# Patient Record
Sex: Female | Born: 1950 | Race: Black or African American | Hispanic: No | Smoking: Never smoker
Health system: Southern US, Community
[De-identification: ages and names within clinical notes are randomized; demographics above are authoritative.]

## PROBLEM LIST (undated history)

## (undated) DIAGNOSIS — E785 Hyperlipidemia, unspecified: Secondary | ICD-10-CM

## (undated) DIAGNOSIS — M8580 Other specified disorders of bone density and structure, unspecified site: Secondary | ICD-10-CM

## (undated) HISTORY — DX: Other specified disorders of bone density and structure, unspecified site: M85.80

## (undated) HISTORY — PX: DILATION AND CURETTAGE OF UTERUS: SHX78

## (undated) HISTORY — PX: CATARACT EXTRACTION: SUR2

## (undated) HISTORY — DX: Hyperlipidemia, unspecified: E78.5

## (undated) HISTORY — PX: COLONOSCOPY: SHX174

## (undated) HISTORY — PX: WISDOM TOOTH EXTRACTION: SHX21

## (undated) HISTORY — PX: OTHER SURGICAL HISTORY: SHX169

---

## 1999-01-01 ENCOUNTER — Emergency Department (HOSPITAL_COMMUNITY): Admission: EM | Admit: 1999-01-01 | Discharge: 1999-01-01 | Payer: Self-pay | Admitting: Emergency Medicine

## 2002-02-22 ENCOUNTER — Other Ambulatory Visit: Admission: RE | Admit: 2002-02-22 | Discharge: 2002-02-22 | Payer: Self-pay | Admitting: Obstetrics and Gynecology

## 2003-05-29 ENCOUNTER — Other Ambulatory Visit: Admission: RE | Admit: 2003-05-29 | Discharge: 2003-05-29 | Payer: Self-pay | Admitting: Obstetrics and Gynecology

## 2004-08-17 ENCOUNTER — Other Ambulatory Visit: Admission: RE | Admit: 2004-08-17 | Discharge: 2004-08-17 | Payer: Self-pay | Admitting: Obstetrics and Gynecology

## 2004-11-16 ENCOUNTER — Ambulatory Visit: Payer: Self-pay | Admitting: Internal Medicine

## 2004-12-06 ENCOUNTER — Ambulatory Visit (HOSPITAL_COMMUNITY): Admission: RE | Admit: 2004-12-06 | Discharge: 2004-12-06 | Payer: Self-pay | Admitting: Gastroenterology

## 2005-04-29 ENCOUNTER — Ambulatory Visit: Payer: Self-pay | Admitting: Internal Medicine

## 2005-10-17 ENCOUNTER — Other Ambulatory Visit: Admission: RE | Admit: 2005-10-17 | Discharge: 2005-10-17 | Payer: Self-pay | Admitting: Obstetrics and Gynecology

## 2008-03-06 ENCOUNTER — Encounter: Payer: Self-pay | Admitting: Internal Medicine

## 2008-03-17 ENCOUNTER — Ambulatory Visit: Payer: Self-pay | Admitting: Internal Medicine

## 2008-03-17 DIAGNOSIS — E785 Hyperlipidemia, unspecified: Secondary | ICD-10-CM | POA: Insufficient documentation

## 2008-03-17 LAB — CONVERTED CEMR LAB
Cholesterol, target level: 200 mg/dL
HDL goal, serum: 40 mg/dL
LDL Goal: 160 mg/dL

## 2008-06-16 ENCOUNTER — Ambulatory Visit: Payer: Self-pay | Admitting: Internal Medicine

## 2008-06-23 ENCOUNTER — Ambulatory Visit: Payer: Self-pay | Admitting: Internal Medicine

## 2008-06-23 ENCOUNTER — Encounter (INDEPENDENT_AMBULATORY_CARE_PROVIDER_SITE_OTHER): Payer: Self-pay | Admitting: *Deleted

## 2008-06-23 LAB — CONVERTED CEMR LAB
Cholesterol, target level: 200 mg/dL
Cholesterol: 217 mg/dL (ref 0–200)
HDL goal, serum: 50 mg/dL
HDL: 79.9 mg/dL (ref >39.0)
LDL Goal: 100 mg/dL
Total Bilirubin: 0.7 mg/dL (ref 0.3–1.2)
Triglycerides: 69 mg/dL (ref 0–149)

## 2008-09-22 ENCOUNTER — Ambulatory Visit: Payer: Self-pay | Admitting: Internal Medicine

## 2008-09-22 LAB — CONVERTED CEMR LAB
ALT: 15 units/L (ref 0–35)
AST: 22 units/L (ref 0–37)
Bilirubin, Direct: 0.1 mg/dL (ref 0.0–0.3)
Cholesterol: 220 mg/dL (ref 0–200)
Direct LDL: 123.2 mg/dL
Total Protein: 7.6 g/dL (ref 6.0–8.3)

## 2008-09-29 ENCOUNTER — Ambulatory Visit: Payer: Self-pay | Admitting: Internal Medicine

## 2008-12-22 ENCOUNTER — Ambulatory Visit: Payer: Self-pay | Admitting: Internal Medicine

## 2008-12-22 DIAGNOSIS — T887XXA Unspecified adverse effect of drug or medicament, initial encounter: Secondary | ICD-10-CM | POA: Insufficient documentation

## 2008-12-26 LAB — CONVERTED CEMR LAB
ALT: 18 units/L (ref 0–35)
AST: 27 units/L (ref 0–37)
Bilirubin, Direct: 0.1 mg/dL (ref 0.0–0.3)
Direct LDL: 115.4 mg/dL
HDL: 83.6 mg/dL (ref >39.0)
Total CHOL/HDL Ratio: 2.5
Triglycerides: 70 mg/dL (ref 0–149)

## 2008-12-29 ENCOUNTER — Encounter (INDEPENDENT_AMBULATORY_CARE_PROVIDER_SITE_OTHER): Payer: Self-pay | Admitting: *Deleted

## 2008-12-31 ENCOUNTER — Ambulatory Visit: Payer: Self-pay | Admitting: Internal Medicine

## 2008-12-31 LAB — CONVERTED CEMR LAB
HDL goal, serum: 50 mg/dL
LDL Goal: 100 mg/dL

## 2009-09-23 ENCOUNTER — Ambulatory Visit: Payer: Self-pay | Admitting: Internal Medicine

## 2009-09-28 ENCOUNTER — Encounter (INDEPENDENT_AMBULATORY_CARE_PROVIDER_SITE_OTHER): Payer: Self-pay | Admitting: *Deleted

## 2009-09-28 LAB — CONVERTED CEMR LAB
ALT: 26 units/L (ref 0–35)
AST: 36 units/L (ref 0–37)
Bilirubin, Direct: 0 mg/dL (ref 0.0–0.3)
Cholesterol: 227 mg/dL — ABNORMAL HIGH (ref 0–200)
HDL: 92 mg/dL (ref >39.00)
Total Protein: 7.6 g/dL (ref 6.0–8.3)
Triglycerides: 94 mg/dL (ref 0.0–149.0)
VLDL: 18.8 mg/dL (ref 0.0–40.0)

## 2010-03-16 ENCOUNTER — Encounter: Payer: Self-pay | Admitting: Internal Medicine

## 2010-03-25 ENCOUNTER — Encounter: Admission: RE | Admit: 2010-03-25 | Discharge: 2010-03-25 | Payer: Self-pay | Admitting: Obstetrics and Gynecology

## 2010-03-30 ENCOUNTER — Ambulatory Visit: Payer: Self-pay | Admitting: Internal Medicine

## 2010-03-30 DIAGNOSIS — M25469 Effusion, unspecified knee: Secondary | ICD-10-CM | POA: Insufficient documentation

## 2010-03-30 DIAGNOSIS — M8580 Other specified disorders of bone density and structure, unspecified site: Secondary | ICD-10-CM | POA: Insufficient documentation

## 2010-04-13 ENCOUNTER — Encounter: Admission: RE | Admit: 2010-04-13 | Discharge: 2010-04-13 | Payer: Self-pay | Admitting: Orthopedic Surgery

## 2010-10-07 ENCOUNTER — Encounter: Payer: Self-pay | Admitting: Internal Medicine

## 2010-12-12 ENCOUNTER — Encounter: Payer: Self-pay | Admitting: Obstetrics and Gynecology

## 2010-12-23 NOTE — Assessment & Plan Note (Signed)
Summary: CPX//FD   Vital Signs:  Patient profile:   60 year old female Height:      60.75 inches Weight:      161.2 pounds BMI:     30.82 Temp:     98.5 degrees F oral Pulse rate:   64 / minute Resp:     14 per minute BP sitting:   126 / 80  (left arm) Cuff size:   large  Vitals Entered By: Shonna Chock (Mar 30, 2010 1:15 PM) CC: Follow-up visit: Discuss Chlosterol, patient states "Not a CPX", Lipid Management Comments REVIEWED MED LIST, PATIENT AGREED DOSE AND INSTRUCTION CORRECT    CC:  Follow-up visit: Discuss Chlosterol, patient states "Not a CPX", and Lipid Management.  History of Present Illness: Sandra [name] is here for med refill of  Crestor & Zetia & for review of labs from Dr [employer] office. She is resuming low cholesterol diet;decreased  CVE due to knee pain but still walking 1 mpd almost daily.NMR reviewed  & risk discussed. No premature MI in FH. LDL 109 , TG 149 & HDL 72 on Crestor & Zetia.  Lipid Management History:      Positive NCEP/ATP III risk factors include female age 1 years old or older.  Negative NCEP/ATP III risk factors include no history of early menopause without estrogen hormone replacement, non-diabetic, HDL cholesterol greater than 60, no family history for ischemic heart disease, non-tobacco-user status, non-hypertensive, no ASHD (atherosclerotic heart disease), no prior stroke/TIA, no peripheral vascular disease, and no history of aortic aneurysm.     Allergies (verified): No Known Drug Allergies  Past History:  Past Medical History: Hyperlipidemia: NMR Lipoprofile 2006: LDL 185(4004/3130), HDL 65, TG 162. LDL goal = < 100(Note: [place] goal = < 160) Osteopenia ? , Dr Arelia Sneddon  Past Surgical History: G 0 P 0; Colonoscopy negative 2006  Review of Systems CV:  Denies chest pain or discomfort, leg cramps with exertion, palpitations, and shortness of breath with exertion. MS:  Complains of joint pain and joint swelling; denies muscle aches and  muscle weakness; S/P cortisone shot L knee by Dr Lajoyce Corners for instability post injury. Also PMH of steroid injections in ankles bilaterally. BMD done by Dr Arelia Sneddon; ? osteopenia. On Ca++ 600 mg once daily  with  vitamin D(?200 International Units).  Physical Exam  General:  well-nourished, alert,appropriate and cooperative throughout examination Head:  Normocephalic and atraumatic without obvious abnormalities.  Neck:  No deformities, masses, or tenderness noted. Lungs:  Normal respiratory effort, chest expands symmetrically. Lungs are clear to auscultation, no crackles or wheezes. Heart:  normal rate, regular rhythm, no gallop, no rub, no JVD, no HJR, and grade 1/2 /6 systolic murmur.   Abdomen:  Bowel sounds positive,abdomen soft and non-tender without masses, organomegaly or hernias noted. Pulses:  R and L carotid,radial,dorsalis pedis and posterior tibial pulses are full and equal bilaterally Extremities:  No clubbing, cyanosis. Large effusion L knee. Crepitus R > L knee Neurologic:  alert & oriented X3 and DTRs symmetrical and normal.   Skin:  Intact without suspicious lesions or rashes Cervical Nodes:  No lymphadenopathy noted Axillary Nodes:  No palpable lymphadenopathy Psych:  memory intact for recent and remote, normally interactive, and good eye contact.     Impression & Recommendations:  Problem # 1:  HYPERLIPIDEMIA (ICD-272.4) Lipids essentially @ goal Her updated medication list for this problem includes:    Crestor 20 Mg Tabs (Rosuvastatin calcium) .Marland Kitchen... 1qd    Zetia 10 Mg Tabs (Ezetimibe) .Marland KitchenMarland KitchenMarland KitchenMarland Kitchen  1 once daily *appointment due**  Problem # 2:  OSTEOPENIA (ICD-733.90) to be verified  Problem # 3:  JOINT EFFUSION, LEFT KNEE (ICD-719.06)  S/P steroid injection X 1  Orders: Orthopedic Referral (Ortho)  Complete Medication List: 1)  Asa 81mg   2)  Crestor 20 Mg Tabs (Rosuvastatin calcium) .Marland Kitchen.. 1qd 3)  Zetia 10 Mg Tabs (Ezetimibe) .Marland Kitchen.. 1 once daily *appointment due** 4)  Lab  Orders- 272.4,995.20  .... Hepatic,lipid,bmp,cbcd,tsh 5)  Tramadol Hcl 50 Mg Tabs (Tramadol hcl) .Marland Kitchen.. 1 every 6 hrs as needed for knee pain  Lipid Assessment/Plan:      Based on NCEP/ATP III, the patient's risk factor category is "0-1 risk factors".  The patient's lipid goals are as follows: Total cholesterol goal is 200; LDL cholesterol goal is 100; HDL cholesterol goal is 50; Triglyceride goal is 150.  Her LDL cholesterol goal has been met.  Secondary causes for hyperlipidemia have been ruled out.  She has been counseled on adjunctive measures for lowering her cholesterol and has been provided with dietary instructions.    Patient Instructions: 1)  Please provide copy of  BMD report. Vitamin D level should be checked annually. Prescriptions: TRAMADOL HCL 50 MG TABS (TRAMADOL HCL) 1 every 6 hrs as needed for knee pain  #30 x 2   Entered and Authorized by:   Marga Melnick MD   Signed by:   Marga Melnick MD on 03/30/2010   Method used:   Print then Give to Patient   RxID:   [id number] ZETIA 10 MG TABS (EZETIMIBE) 1 once daily *APPOINTMENT DUE**  #30 x 11   Entered and Authorized by:   Marga Melnick MD   Signed by:   Marga Melnick MD on 03/30/2010   Method used:   Print then Give to Patient   RxID:   [id number] CRESTOR 20 MG  TABS (ROSUVASTATIN CALCIUM) 1qd  #30 Tablet x 11   Entered and Authorized by:   Marga Melnick MD   Signed by:   Marga Melnick MD on 03/30/2010   Method used:   Print then Give to Patient   RxID:   [id number]

## 2010-12-23 NOTE — Medication Information (Signed)
Summary: Nonadherence with Zetia/[facility]  Nonadherence with Zetia/[facility]   Imported By: Lanelle Bal 11/01/2010 14:22:24  _____________________________________________________________________  External Attachment:    Type:   Image     Comment:   External Document

## 2011-04-08 NOTE — Op Note (Signed)
[name], [name]                ACCOUNT NO.:  [id number]   MEDICAL RECORD NO.:  [id number]          PATIENT TYPE:  AMB   LOCATION:  ENDO                         FACILITY:  [facility]   PHYSICIAN:  Graylin Shiver, M.D.   DATE OF BIRTH:  Aug 27, 1951   DATE OF PROCEDURE:  12/06/2004  DATE OF DISCHARGE:                                 OPERATIVE REPORT   INDICATION:  Screening.   Informed consent was obtained after explanation of the risks of bleeding,  infection, and perforation.   PREMEDICATIONS:  Fentanyl 62.5 mcg IV, versed 6 mg IV.   PROCEDURE:  With the patient in the left lateral decubitus position, a  rectal exam was performed.  No masses were felt.  The Olympus colonoscope  was inserted into the rectum and advanced around the colon to the cecum.  Cecal landmarks were identified.  The cecum and ascending colon were normal.  The transverse colon normal.  The descending colon, sigmoid, and rectum were  normal.  She tolerated the procedure well without complications.   IMPRESSION:  Normal colonoscopy to the cecum.   I would recommend a follow-up colonoscopy again in 10 years.      SFG/MEDQ  D:  12/06/2004  T:  12/06/2004  Job:  [id number]   cc:   Juluis Mire, M.D.  [address] [place]  [place] [address]  Fax: [phone]

## 2011-05-06 ENCOUNTER — Other Ambulatory Visit: Payer: Self-pay | Admitting: Internal Medicine

## 2011-06-14 ENCOUNTER — Telehealth: Payer: Self-pay | Admitting: Internal Medicine

## 2011-06-14 MED ORDER — ROSUVASTATIN CALCIUM 20 MG PO TABS: 20.0000 mg | ORAL_TABLET | Freq: Every day | ORAL | Status: DC

## 2011-06-14 MED ORDER — EZETIMIBE 10 MG PO TABS: 10.0000 mg | ORAL_TABLET | Freq: Every day | ORAL | Status: DC

## 2011-06-14 NOTE — Telephone Encounter (Signed)
Sent in

## 2011-08-18 ENCOUNTER — Encounter: Payer: Self-pay | Admitting: Internal Medicine

## 2011-08-18 ENCOUNTER — Ambulatory Visit (INDEPENDENT_AMBULATORY_CARE_PROVIDER_SITE_OTHER): Payer: 59 | Admitting: Internal Medicine

## 2011-08-18 DIAGNOSIS — M25572 Pain in left ankle and joints of left foot: Secondary | ICD-10-CM

## 2011-08-18 DIAGNOSIS — M25579 Pain in unspecified ankle and joints of unspecified foot: Secondary | ICD-10-CM

## 2011-08-18 DIAGNOSIS — M2140 Flat foot [pes planus] (acquired), unspecified foot: Secondary | ICD-10-CM

## 2011-08-18 NOTE — Patient Instructions (Signed)
Please schedule an appointment with the [facility]; review and correct this note prior to that visit. If you need an official referral please call.

## 2011-08-18 NOTE — Progress Notes (Signed)
  Subjective:    Patient ID: Sandra [name], female    DOB: 06/28/1951, 60 y.o.   MRN: [id number]  HPI Extremity lesion Location:inferior to L medial malleolus Onset:2 weeks ago as "knot"  Trigger/injury:no but fracture L lateral malleolar area Pain quality:"catching"/ soreness Pain severity:up to 8-9 after standing for > 6 hrs working part time @ [employer] Duration:hours Radiation:no Exacerbating factors:standing as noted Treatment/response: Aleve/ no benefit Review of systems: Constitutional: no fever, chills, sweats, change in weight  Musculoskeletal:no  muscle cramps or pain; no  joint stiffness, redness, or swelling Skin:no rash, color change Neuro: no weakness;  numbness and tingling Heme:no lymphadenopathy; abnormal bruising or bleeding       Review of Systems     Objective:   Physical Exam   She is healthy and well-nourished in appearance; she is in no distress.  Skin over the feet appears normal with no ischemic changes  The pedal pulses are excellent. Strength and deep tendon reflexes are normal in the  lower extremities. Range of motion is equal at in both ankles. 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 This appears slightly larger than a  similar finding in the same area in the right medial ankle.  The most striking change is pes planus.        Assessment & Plan:  #1 ankle dysfunction after prolonged standing. There does appear to be some asymmetry of the osseous structures. She has significant pes planus.  Plan: I would recommend assessment by a Podiatrist. She would most likely benefit from appropriate orthopedic footwear. The present symptoms may be related to the prior ankle fracture & pre-existing  pes planus.

## 2011-09-12 ENCOUNTER — Ambulatory Visit (INDEPENDENT_AMBULATORY_CARE_PROVIDER_SITE_OTHER): Payer: 59 | Admitting: Internal Medicine

## 2011-09-12 ENCOUNTER — Encounter: Payer: Self-pay | Admitting: Internal Medicine

## 2011-09-12 VITALS — BP 128/90 | HR 88 | Temp 98.4°F | Resp 14 | Ht 61.75 in | Wt 177.8 lb

## 2011-09-12 DIAGNOSIS — M899 Disorder of bone, unspecified: Secondary | ICD-10-CM

## 2011-09-12 DIAGNOSIS — M949 Disorder of cartilage, unspecified: Secondary | ICD-10-CM

## 2011-09-12 DIAGNOSIS — E785 Hyperlipidemia, unspecified: Secondary | ICD-10-CM

## 2011-09-12 DIAGNOSIS — Z0000 Encounter for general adult medical examination without abnormal findings: Secondary | ICD-10-CM

## 2011-09-12 LAB — CBC WITH DIFFERENTIAL/PLATELET
Basophils Absolute: 0.1 10*3/uL (ref 0.0–0.1)
Basophils Relative: 0.8 % (ref 0.0–3.0)
Eosinophils Absolute: 0 10*3/uL (ref 0.0–0.7)
HCT: 39.5 % (ref 36.0–46.0)
Hemoglobin: 13.2 g/dL (ref 12.0–15.0)
Lymphs Abs: 1.5 10*3/uL (ref 0.7–4.0)
MCV: 95.6 fl (ref 78.0–100.0)
Monocytes Absolute: 0.6 10*3/uL (ref 0.1–1.0)
Monocytes Relative: 8 % (ref 3.0–12.0)
Neutrophils Relative %: 70.9 % (ref 43.0–77.0)
Platelets: 256 10*3/uL (ref 150.0–400.0)

## 2011-09-12 LAB — BASIC METABOLIC PANEL
Calcium: 9.1 mg/dL (ref 8.4–10.5)
Glucose, Bld: 93 mg/dL (ref 70–99)
Sodium: 143 mEq/L (ref 135–145)

## 2011-09-12 LAB — HEPATIC FUNCTION PANEL
AST: 23 U/L (ref 0–37)
Albumin: 3.9 g/dL (ref 3.5–5.2)

## 2011-09-12 LAB — TSH: TSH: 1.39 u[IU]/mL (ref 0.35–5.50)

## 2011-09-12 NOTE — Progress Notes (Signed)
Subjective:    Patient ID: Sandra [name], female    DOB: 04/13/1951, 60 y.o.   MRN: [id number]  HPI  Sandra [name] is here for a physical; she has no acute issues.      Review of Systems Patient reports no vision/ hearing  changes, adenopathy,fever, weight change,  persistant / recurrent hoarseness , swallowing issues, chest pain,palpitations,edema,persistant /recurrent cough, hemoptysis, dyspnea( rest/ exertional/paroxysmal nocturnal), gastrointestinal bleeding(melena, rectal bleeding), abdominal pain, significant heartburn,  bowel changes,GU symptoms(dysuria, hematuria,pyuria, incontinence ), Gyn symptoms(abnormal  bleeding , pain),  syncope, focal weakness, memory loss,numbness & tingling, skin/hair /nail changes,abnormal bruising or bleeding, anxiety,or depression.   She states that she is not exercising on a regular basis. She feels her diet is not optimal.    Objective:   Physical Exam Gen.: Healthy and well-nourished in appearance. Alert, appropriate and cooperative throughout exam. Head: Normocephalic without obvious abnormalities Eyes: No corneal or conjunctival inflammation noted. Pupils equal round reactive to light and accommodation. Fundal exam poorly visualized. Extraocular motion intact. Vision grossly normal with lenses. Ears: External  ear exam reveals no significant lesions or deformities. Canals clear .TMs normal. Hearing is grossly normal bilaterally. Nose: External nasal exam reveals no deformity or inflammation. Nasal mucosa are pink and moist. No lesions or exudates noted.  Mouth: Oral mucosa and oropharynx reveal no lesions or exudates. Teeth in good repair. Neck: No deformities, masses, or tenderness noted. Range of motion & . Thyroid normal. Lungs: Normal respiratory effort; chest expands symmetrically. Lungs are clear to auscultation without rales, wheezes, or increased work of breathing. Heart: Normal rate and rhythm. Normal S1 and S2. No gallop, click, or rub.  Intermittent Grade 1/2 systolic basilar  murmur. Abdomen: Bowel sounds normal; abdomen soft and nontender. No masses, organomegaly or hernias noted. Genitalia: Dr Arelia Sneddon   .                                                                                   Musculoskeletal/extremities: Slight lordosis  noted of  the thoracic  spine. No clubbing, cyanosis, edema, or deformity noted. Range of motion  normal .Tone & strength  normal.Joints normal. Nail health  Good.Pes planus. Crepitus L knee w/o effusion Vascular: Carotid, radial artery, dorsalis pedis and  posterior tibial pulses are full and equal. No bruits present. Neurologic: Alert and oriented x3. Deep tendon reflexes symmetrical and normal.         Skin: Intact without suspicious lesions or rashes. Lymph: No cervical, axillary lymphadenopathy present. Psych: Mood and affect are normal. Normally interactive                                                                                         Assessment & Plan:  #1 comprehensive physical exam; no acute findings #2 see Problem List with Assessments & Recommendations #3 she has a  history in her family of premature coronary disease; her MMR suggested a 40% long term risk previously Plan: see Orders  EKG: Normal except for one PAC. Low voltage in the precordial leads is related to body habitus/breast tissue. She does describe rare palpitations. Prior EKG 11/16/2004 revealed minor early repolarization changes.

## 2011-09-12 NOTE — Patient Instructions (Signed)
Preventive Health Care: Exercise  30-45  minutes a day, 3-4 days a week. Walking is especially valuable in preventing Osteoporosis. Eat a low-fat diet with lots of fruits and vegetables, up to 7-9 servings per day. Consume less than 30 grams of sugar per day from foods & drinks with High Fructose Corn Syrup as # 1,2,3 or #4 on label. Health Care Power of Attorney & Living Will place you in charge of your health care  decisions. Verify these are  in place. To prevent palpitations or premature beats, avoid stimulants such as decongestants, diet pills, nicotine, or caffeine (coffee, tea, cola, or chocolate) to excess.  Blood Pressure Goal  Ideally is an AVERAGE < 135/85. This AVERAGE should be calculated from @ least 5-7 BP readings taken @ different times of day on different days of week. You should not respond to isolated BP readings , but rather the AVERAGE for that week

## 2011-09-22 ENCOUNTER — Encounter: Payer: Self-pay | Admitting: Internal Medicine

## 2011-10-03 ENCOUNTER — Encounter: Payer: Self-pay | Admitting: Internal Medicine

## 2011-11-25 ENCOUNTER — Other Ambulatory Visit: Payer: Self-pay | Admitting: Internal Medicine

## 2011-11-25 NOTE — Telephone Encounter (Signed)
rx sent to pharmacy by e-script  

## 2012-03-10 ENCOUNTER — Other Ambulatory Visit: Payer: Self-pay | Admitting: Internal Medicine

## 2012-10-16 ENCOUNTER — Other Ambulatory Visit: Payer: Self-pay | Admitting: Internal Medicine

## 2012-10-16 NOTE — Telephone Encounter (Signed)
Rx sent for 30 day supply only pt need appt last seen 10/12.    MW

## 2012-12-08 ENCOUNTER — Other Ambulatory Visit: Payer: Self-pay | Admitting: Internal Medicine

## 2012-12-10 NOTE — Telephone Encounter (Signed)
Patient needs to schedule a CPX with fasting labs

## 2013-01-07 ENCOUNTER — Encounter: Payer: Self-pay | Admitting: Family Medicine

## 2013-01-07 ENCOUNTER — Encounter: Payer: Self-pay | Admitting: Lab

## 2013-01-07 ENCOUNTER — Ambulatory Visit (INDEPENDENT_AMBULATORY_CARE_PROVIDER_SITE_OTHER)
Admission: RE | Admit: 2013-01-07 | Discharge: 2013-01-07 | Disposition: A | Discharge status: Home / Self Care | Payer: 59 | Source: Ambulatory Visit | Attending: Family Medicine | Admitting: Family Medicine

## 2013-01-07 ENCOUNTER — Ambulatory Visit (INDEPENDENT_AMBULATORY_CARE_PROVIDER_SITE_OTHER): Payer: 59 | Admitting: Family Medicine

## 2013-01-07 VITALS — BP 120/70 | HR 97 | Temp 98.3°F | Wt 184.8 lb

## 2013-01-07 DIAGNOSIS — J209 Acute bronchitis, unspecified: Secondary | ICD-10-CM

## 2013-01-07 MED ORDER — GUAIFENESIN-CODEINE 100-10 MG/5ML PO SYRP: ORAL_SOLUTION | ORAL | Status: DC

## 2013-01-07 MED ORDER — AZITHROMYCIN 250 MG PO TABS: ORAL_TABLET | ORAL | Status: DC

## 2013-01-07 NOTE — Progress Notes (Signed)
  Subjective:     Sandra [name] is a 62 y.o. female here for evaluation of a cough. Onset of symptoms was 6 days ago. Symptoms have been gradually worsening since that time. The cough is productive and is aggravated by infection and reclining position. Associated symptoms include: shortness of breath, sputum production and wheezing. Patient does not have a history of asthma. Patient does not have a history of environmental allergens. Patient has not traveled recently. Patient does not have a history of smoking. Patient has not had a previous chest x-ray. Patient has not had a PPD done.  The following portions of the patient's history were reviewed and updated as appropriate:  She  has a past medical history of Hyperlipidemia and Osteopenia. She  does not have any pertinent problems on file. She  has past surgical history that includes G0P0 and Wisdom tooth extraction. Her family history includes Cancer in her paternal aunt; Diabetes in her other; Heart disease in her mother; Hyperlipidemia in her brother and sister; Hypertension in her other; and Stroke in her father. She  reports that she has never smoked. She does not have any smokeless tobacco history on file. She reports that she drinks about 4.2 ounces of alcohol per week. She reports that she does not use illicit drugs. She has a current medication list which includes the following prescription(s): aspirin, ezetimibe, rosuvastatin, and tramadol. Current Outpatient Prescriptions on File Prior to Visit  Medication Sig Dispense Refill  . aspirin 81 MG tablet Take 81 mg by mouth daily.        Marland Kitchen ezetimibe (ZETIA) 10 MG tablet 1 BY MOUTH DAILY, APPOINTMENT OVERDUE  30 tablet  0  . rosuvastatin (CRESTOR) 20 MG tablet 1 by mouth daily, APPOINTMENT OVERDUE  30 tablet  0  . traMADol (ULTRAM) 50 MG tablet Take 50 mg by mouth every 6 (six) hours as needed.         No current facility-administered medications on file prior to visit.   She has No Known  Allergies..  Review of Systems Pertinent items are noted in HPI.    Objective:    Oxygen saturation 96% on room air BP 120/70  Pulse 97  Temp(Src) 98.3 F (36.8 C) (Oral)  Wt 184 lb 12.8 oz (83.825 kg)  BMI 34.09 kg/m2  SpO2 96% General appearance: alert, cooperative, appears stated age and no distress Ears: normal TM's and external ear canals both ears Nose: Nares normal. Septum midline. Mucosa normal. No drainage or sinus tenderness. Throat: lips, mucosa, and tongue normal; teeth and gums normal Neck: mild anterior cervical adenopathy, supple, symmetrical, trachea midline and thyroid not enlarged, symmetric, no tenderness/mass/nodules Lungs: rhonchi LLL and LUL and wheezes LLL and LUL Heart: S1, S2 normal    Assessment:    Acute Bronchitis    Plan:    Antibiotics per medication orders. Antitussives per medication orders. Avoid exposure to tobacco smoke and fumes. B-agonist inhaler. Call if shortness of breath worsens, blood in sputum, change in character of cough, development of fever or chills, inability to maintain nutrition and hydration. Avoid exposure to tobacco smoke and fumes. Chest x-ray. f/u prn

## 2013-01-07 NOTE — Patient Instructions (Signed)

## 2013-01-08 MED ORDER — ALBUTEROL SULFATE (2.5 MG/3ML) 0.083% IN NEBU
2.5000 mg | INHALATION_SOLUTION | Freq: Once | RESPIRATORY_TRACT | Status: AC
  Administered 2013-01-07: 2.5 mg via RESPIRATORY_TRACT

## 2013-01-08 NOTE — Addendum Note (Signed)
Addended by: Arnette Norris on: 01/08/2013 09:20 AM   Modules accepted: Orders

## 2013-01-22 ENCOUNTER — Other Ambulatory Visit: Payer: Self-pay | Admitting: Internal Medicine

## 2013-01-22 NOTE — Telephone Encounter (Signed)
Lipid/Hep 272.4/995.20  

## 2013-02-21 ENCOUNTER — Other Ambulatory Visit (INDEPENDENT_AMBULATORY_CARE_PROVIDER_SITE_OTHER): Payer: 59

## 2013-02-21 DIAGNOSIS — T887XXA Unspecified adverse effect of drug or medicament, initial encounter: Secondary | ICD-10-CM

## 2013-02-21 DIAGNOSIS — E785 Hyperlipidemia, unspecified: Secondary | ICD-10-CM

## 2013-02-21 LAB — HEPATIC FUNCTION PANEL
Albumin: 3.7 g/dL (ref 3.5–5.2)
Alkaline Phosphatase: 77 U/L (ref 39–117)
Bilirubin, Direct: 0 mg/dL (ref 0.0–0.3)

## 2013-02-21 LAB — LIPID PANEL
Total CHOL/HDL Ratio: 3
VLDL: 35 mg/dL (ref 0.0–40.0)

## 2013-03-01 ENCOUNTER — Ambulatory Visit (INDEPENDENT_AMBULATORY_CARE_PROVIDER_SITE_OTHER): Payer: 59 | Admitting: Internal Medicine

## 2013-03-01 ENCOUNTER — Encounter: Payer: Self-pay | Admitting: Internal Medicine

## 2013-03-01 VITALS — BP 130/78 | HR 98 | Wt 185.0 lb

## 2013-03-01 DIAGNOSIS — E559 Vitamin D deficiency, unspecified: Secondary | ICD-10-CM

## 2013-03-01 DIAGNOSIS — E785 Hyperlipidemia, unspecified: Secondary | ICD-10-CM

## 2013-03-01 MED ORDER — ROSUVASTATIN CALCIUM 20 MG PO TABS: ORAL_TABLET | ORAL | Status: DC

## 2013-03-01 MED ORDER — EZETIMIBE 10 MG PO TABS: ORAL_TABLET | ORAL | Status: DC

## 2013-03-01 NOTE — Patient Instructions (Addendum)
Please  schedule fasting Labs in 12 weeks : CK,Lipids, hepatic panel, TSH, vitamin 1,25 OH D3. PLEASE BRING THESE INSTRUCTIONS TO FOLLOW UP  LAB APPOINTMENT.This will guarantee correct labs are drawn, eliminating need for repeat blood sampling ( needle sticks ! ). Diagnoses /Codes: 272.4,995.20,V17.3, 268.9

## 2013-03-01 NOTE — Assessment & Plan Note (Addendum)
Cardiovascular exercise, this can be as simple a program as walking, is recommended 30-45 minutes 3-4 times per week. If you're not exercising you should take 6-8 weeks to build up to this level. Risk of premature heart attack or stroke increases as LDL or BAD cholesterol rises, especially if there is family history of heart attack in males before 74 or women before 58.The best dietary  information on cholesterol is Dr Gildardo Griffes book Eat, Drink & Be Healthy. ASA 81 mg daily. Please  schedule fasting Labs in 12 weeks after nutrition & exercise changes.

## 2013-03-01 NOTE — Progress Notes (Signed)
  Subjective:    Patient ID: Sandra [name], female    DOB: 19-Sep-1951, 62 y.o.   MRN: [id number]  HPI Dyslipidemia assessment: Prior Advanced Lipid Testing  NMR documents LDL goal < 100  ; ideally <  70.[facility] Heart Panel 2012: CRP 16.1,Lp(a) > 460, hyperabsorber (E3/E4 Genotype) Family history of premature CAD/ MI  present in  [facility] .  Off heart healthy diet almost 1 year No regular Exercise . No PMH of Diabetes . No PMH of HTN . No Smoking history   Lab results reviewed & risks/ options discussed      Review of Systems Weight stable up 15 pounds No significant fatigue No chest pain  ;claudication;   dyspnea; PND. Rare palpitations No abd pain/bowel changes ; myalgias No significant  lightheadedness;  syncope ; memory loss No skin/hair/nail changes     Objective:   Physical Exam Appears  well-nourished & in no acute distress  No carotid bruits are present.No neck pain distention present at 10 - 15 degrees. Thyroid normal to palpation  Heart rhythm and rate are normal with no significant murmurs or gallops. S4  Chest is clear with no increased work of breathing  There is no evidence of aortic aneurysm or renal artery bruits  Abdomen soft with no organomegaly or masses. No HJR  No clubbing, cyanosis or edema present.  Pedal pulses are intact   No ischemic skin changes are present . Nails healthy   Alert and oriented. Strength, tone, DTRs reflexes normal          Assessment & Plan:

## 2013-03-24 ENCOUNTER — Other Ambulatory Visit: Payer: Self-pay | Admitting: Internal Medicine

## 2013-05-17 ENCOUNTER — Encounter: Payer: Self-pay | Admitting: Family Medicine

## 2013-05-17 ENCOUNTER — Ambulatory Visit (INDEPENDENT_AMBULATORY_CARE_PROVIDER_SITE_OTHER): Payer: 59 | Admitting: Family Medicine

## 2013-05-17 VITALS — BP 130/88 | HR 105 | Temp 99.5°F | Wt 182.6 lb

## 2013-05-17 DIAGNOSIS — J209 Acute bronchitis, unspecified: Secondary | ICD-10-CM

## 2013-05-17 MED ORDER — ALBUTEROL SULFATE HFA 108 (90 BASE) MCG/ACT IN AERS: 2.0000 | INHALATION_SPRAY | Freq: Four times a day (QID) | RESPIRATORY_TRACT | Status: DC | PRN

## 2013-05-17 MED ORDER — AZITHROMYCIN 250 MG PO TABS: ORAL_TABLET | ORAL | Status: DC

## 2013-05-17 MED ORDER — ALBUTEROL SULFATE (5 MG/ML) 0.5% IN NEBU
2.5000 mg | INHALATION_SOLUTION | Freq: Once | RESPIRATORY_TRACT | Status: AC
  Administered 2013-05-17: 2.5 mg via RESPIRATORY_TRACT

## 2013-05-17 MED ORDER — IPRATROPIUM BROMIDE 0.02 % IN SOLN
0.5000 mg | Freq: Once | RESPIRATORY_TRACT | Status: AC
  Administered 2013-05-17: 0.5 mg via RESPIRATORY_TRACT

## 2013-05-17 MED ORDER — PROMETHAZINE-DM 6.25-15 MG/5ML PO SYRP: 5.0000 mL | ORAL_SOLUTION | Freq: Four times a day (QID) | ORAL | Status: DC | PRN

## 2013-05-17 NOTE — Patient Instructions (Addendum)
Start the Zpack as directed Use the cough syrup as needed- will cause drowsiness Mucinex DM for cough when you can't be drowsy Use the albuterol inhaler- 2 puffs every 4 hrs as needed for wheezing/shortness of breath REST! Hang in there!

## 2013-05-17 NOTE — Assessment & Plan Note (Signed)
New.  Pt's wheezing improved s/p neb tx.  Script for albuterol given.  Start Zpack for possible atypical infxn.  Cough meds prn.  Reviewed supportive care and red flags that should prompt return.  Pt expressed understanding and is in agreement w/ plan.

## 2013-05-17 NOTE — Progress Notes (Signed)
  Subjective:    Patient ID: Sandra [name], female    DOB: Jan 25, 1951, 62 y.o.   MRN: [id number]  HPI URI- sxs started 1 week ago w/ sore throat.  + nasal congestion.  Using Mucinex and Vicks Vapor Rub.  Cough is now productive.  + facial pain.  No ear pain.  No fever at home, low grade today.  No N/V/D.  No known sick contacts.  + SOB and wheezing.   Review of Systems For ROS see HPI     Objective:   Physical Exam  Vitals reviewed. Constitutional: She appears well-developed and well-nourished. No distress.  HENT:  Head: Normocephalic and atraumatic.  TMs normal bilaterally Mild nasal congestion Throat w/out erythema, edema, or exudate  Eyes: Conjunctivae and EOM are normal. Pupils are equal, round, and reactive to light.  Neck: Normal range of motion. Neck supple.  Cardiovascular: Normal rate, regular rhythm, normal heart sounds and intact distal pulses.   No murmur heard. Pulmonary/Chest: Effort normal. No respiratory distress. She has wheezes (diffuse expiratory wheezes- improved s/p neb tx). She has no rales.  + hacking cough  Lymphadenopathy:    She has no cervical adenopathy.          Assessment & Plan:

## 2013-05-23 ENCOUNTER — Other Ambulatory Visit (INDEPENDENT_AMBULATORY_CARE_PROVIDER_SITE_OTHER): Payer: 59

## 2013-05-23 DIAGNOSIS — E785 Hyperlipidemia, unspecified: Secondary | ICD-10-CM

## 2013-05-23 DIAGNOSIS — Z8249 Family history of ischemic heart disease and other diseases of the circulatory system: Secondary | ICD-10-CM

## 2013-05-23 DIAGNOSIS — T887XXA Unspecified adverse effect of drug or medicament, initial encounter: Secondary | ICD-10-CM

## 2013-05-23 DIAGNOSIS — E559 Vitamin D deficiency, unspecified: Secondary | ICD-10-CM

## 2013-05-23 LAB — LIPID PANEL
HDL: 50.9 mg/dL (ref >39.00)
LDL Cholesterol: 114 mg/dL — ABNORMAL HIGH (ref 0–99)
Total CHOL/HDL Ratio: 4
Triglycerides: 137 mg/dL (ref 0.0–149.0)

## 2013-05-23 LAB — TSH: TSH: 1.35 u[IU]/mL (ref 0.35–5.50)

## 2013-05-23 LAB — HEPATIC FUNCTION PANEL: Albumin: 3.9 g/dL (ref 3.5–5.2)

## 2013-05-27 ENCOUNTER — Encounter: Payer: Self-pay | Admitting: *Deleted

## 2013-05-28 LAB — VITAMIN D 1,25 DIHYDROXY
Vitamin D2 1, 25 (OH)2: <8 pg/mL
Vitamin D3 1, 25 (OH)2: 37 pg/mL

## 2013-09-16 ENCOUNTER — Encounter: Payer: Self-pay | Admitting: Internal Medicine

## 2013-09-16 ENCOUNTER — Ambulatory Visit (INDEPENDENT_AMBULATORY_CARE_PROVIDER_SITE_OTHER): Payer: 59 | Admitting: Internal Medicine

## 2013-09-16 VITALS — BP 122/80 | HR 94 | Temp 97.0°F | Resp 13 | Wt 185.8 lb

## 2013-09-16 DIAGNOSIS — R609 Edema, unspecified: Secondary | ICD-10-CM

## 2013-09-16 DIAGNOSIS — M7989 Other specified soft tissue disorders: Secondary | ICD-10-CM | POA: Insufficient documentation

## 2013-09-16 DIAGNOSIS — R030 Elevated blood-pressure reading, without diagnosis of hypertension: Secondary | ICD-10-CM

## 2013-09-16 MED ORDER — HYDROCHLOROTHIAZIDE 12.5 MG PO CAPS: 12.5000 mg | ORAL_CAPSULE | Freq: Every day | ORAL | Status: DC

## 2013-09-16 NOTE — Patient Instructions (Addendum)
Your next office appointment will be determined based upon response to mediaction.  Minimal Blood Pressure Goal= AVERAGE < 140/90;  Ideal is an AVERAGE < 135/85. This AVERAGE should be calculated from @ least 5-7 BP readings taken @ different times of day on different days of week. You should not respond to isolated BP readings , but rather the AVERAGE for that week .Please bring your  blood pressure cuff to office visits to verify that it is reliable.It  can also be checked against the blood pressure device at the pharmacy. Finger or wrist cuffs are not dependable; an arm cuff is. Avoid ingestion of  excess salt/sodium.Cook with pepper & other spices . Use the salt substitute "No Salt" OR the Mrs Sharilyn Sites products to season food @ the table. Avoid foods which taste salty or "vinegary" as their sodium content will be high.

## 2013-09-16 NOTE — Progress Notes (Signed)
  Subjective:    Patient ID: Sandra [name], female    DOB: 1950-12-30, 62 y.o.   MRN: [id number]  HPI    After Ophthalmology evaluation for floaters; she has noted BP variation. Blood pressure range 124/83-145/100 w/o meds.  FH: mother , sister, & 2 brothers have HTN    Review of Systems Significant headaches, epistaxis, chest pain, palpitations, exertional dyspnea, claudication, paroxysmal nocturnal dyspnea or lightheadedness absent. Occasional ankle edema after standing. .   Objective:   Physical Exam Appears healthy and well-nourished & in no acute distress  No carotid bruits are present.No neck pain distention present at 10 - 15 degrees. Thyroid normal to palpation  Heart rhythm and rate are normal . Grade 1 brisk systolic  Murmur;no gallops.  Chest is clear with no increased work of breathing  There is no evidence of aortic aneurysm or renal artery bruits  Abdomen soft with no organomegaly or masses. No HJR  No clubbing, cyanosis or edema present.  Pedal pulses are intact   No ischemic skin changes are present . Nails healthy with chronic fungal changes   Alert and oriented. Strength, tone, DTRs reflexes normal          Assessment & Plan:  See Current Assessment & Plan in Problem List under specific Diagnosis

## 2013-12-02 ENCOUNTER — Other Ambulatory Visit: Payer: Self-pay | Admitting: Internal Medicine

## 2013-12-02 NOTE — Telephone Encounter (Signed)
Zetia and Crestor refilled per protocol. JG//CMA

## 2014-03-10 ENCOUNTER — Ambulatory Visit (INDEPENDENT_AMBULATORY_CARE_PROVIDER_SITE_OTHER): Payer: 59 | Admitting: Podiatry

## 2014-03-10 ENCOUNTER — Ambulatory Visit (INDEPENDENT_AMBULATORY_CARE_PROVIDER_SITE_OTHER): Payer: 59

## 2014-03-10 ENCOUNTER — Encounter: Payer: Self-pay | Admitting: Podiatry

## 2014-03-10 VITALS — BP 114/69 | HR 89 | Resp 18

## 2014-03-10 DIAGNOSIS — M779 Enthesopathy, unspecified: Secondary | ICD-10-CM

## 2014-03-10 DIAGNOSIS — R52 Pain, unspecified: Secondary | ICD-10-CM

## 2014-03-10 NOTE — Progress Notes (Signed)
   Subjective:    Patient ID: Sandra [name], female    DOB: Nov 28, 1950, 63 y.o.   MRN: [id number]  HPI I need some new inserts and I broke my left ankle in 2011 and when I stand a long period and it is hard to walk and it has a ache in it and I know that it is there and my right foot is sore sometimes when I stand   This patient presents describing pain in the dorsal aspect of the left foot and left ankle area when walking. Symptoms have been persistent for some several years. She has existing pair of orthotics that she wears when walking. 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 Her other job is sedentary and she denies any foot or ankle pain at work.    Review of Systems  HENT:       Ringing in ears       Objective:   Physical Exam  Orientated [phone] 63 year old female  Vascular: DP and PT pulses 2/4 bilaterally  Neurological: Knee and ankle reflexes equal and reactive bilaterally  Dermatological: Texture and turgor within normal limits bilaterally  Musculoskeletal: Patient has difficulty heeling off on the left foot unilaterally and can easily heel off on the right. There is palpable tenderness in the dorsal left midfoot area without a specific palpable lesions. Some mild tenderness in to palpation on the anterior left ankle without a palpable lesions.  X-ray examination left ankle   Intact bony structure without fracture or dislocation noted. Evidence of well-healed fracture distal fibula noted.  Radiographic compression: No acute bony abnormality noted  X-ray examination left foot   Intact bony structure without fracture or dislocation. Dorsal exostosis noted on the lateral view at the level of the talar navicular joint. Mild bunion deformity noted.  Radiographic impression: no acute bony abnormality noted       Assessment & Plan:  Assessment: Low-grade midfoot osteoarthritis  left Ankle arthralgia left, may be associated with old fracture Satisfactory fit of existing custom rigid orthotics  Plan: The patient is advised to wear her existing ankle stabilizer with her custom orthotics when she works the weekend job Suggested over-the-counter Aleve by mouth one twice a day when working weekend job  I made patient wear that she could have a replacement orthotics, however, I do not think that changing out to a new orthotic would make any difference in the symptoms.

## 2014-03-10 NOTE — Patient Instructions (Signed)
Wear the existing ankle stabilizer on the left ankle when working on the weekend. On the weekend take over-the-counter Aleve 1 tablet twice a day Continue wearing her existing orthotics when working on the weekend. If you want a replacement pair of orthotics notify our office and we will arrange for a scan and replacement orthotics

## 2014-03-11 ENCOUNTER — Encounter: Payer: Self-pay | Admitting: Podiatry

## 2014-05-01 ENCOUNTER — Other Ambulatory Visit: Payer: Self-pay | Admitting: Internal Medicine

## 2014-05-02 ENCOUNTER — Other Ambulatory Visit (INDEPENDENT_AMBULATORY_CARE_PROVIDER_SITE_OTHER): Payer: 59

## 2014-05-02 ENCOUNTER — Encounter: Payer: Self-pay | Admitting: Internal Medicine

## 2014-05-02 ENCOUNTER — Ambulatory Visit (INDEPENDENT_AMBULATORY_CARE_PROVIDER_SITE_OTHER): Payer: 59 | Admitting: Internal Medicine

## 2014-05-02 VITALS — BP 110/80 | HR 108 | Temp 98.4°F | Wt 191.2 lb

## 2014-05-02 DIAGNOSIS — I10 Essential (primary) hypertension: Secondary | ICD-10-CM

## 2014-05-02 DIAGNOSIS — E669 Obesity, unspecified: Secondary | ICD-10-CM | POA: Insufficient documentation

## 2014-05-02 LAB — BASIC METABOLIC PANEL
BUN: 13 mg/dL (ref 6–23)
CHLORIDE: 103 meq/L (ref 96–112)
CO2: 27 mEq/L (ref 19–32)
Calcium: 9.8 mg/dL (ref 8.4–10.5)
Creatinine, Ser: 1 mg/dL (ref 0.4–1.2)
GFR: 72.56 mL/min (ref >60.00)
Glucose, Bld: 99 mg/dL (ref 70–99)
POTASSIUM: 3.7 meq/L (ref 3.5–5.1)
Sodium: 140 mEq/L (ref 135–145)

## 2014-05-02 MED ORDER — HYDROCHLOROTHIAZIDE 12.5 MG PO CAPS: ORAL_CAPSULE | ORAL | Status: DC

## 2014-05-02 NOTE — Progress Notes (Signed)
Pre visit review using our clinic review tool, if applicable. No additional management support is needed unless otherwise documented below in the visit note. 

## 2014-05-02 NOTE — Progress Notes (Signed)
   Subjective:    Patient ID: Sandra [name], female    DOB: [birth year], 63 y.o.   MRN: [id number]  HPI Blood pressure average : < 140/90 Compliant with anti hypertemsive medication. No lightheadedness or other adverse medication effect described.  A heart healthy /low salt diet is followed. Exercise is walking dogs @ least several times per week without symptoms.  Family history is + for HTN /CVA     Review of Systems   Significant headaches, epistaxis, chest pain, palpitations, exertional dyspnea, claudication, paroxysmal nocturnal dyspnea, or edema absent.       Objective:   Physical Exam  Appears healthy and well-nourished & in no acute distress.Obesity present as per CDC Guidelines  No carotid bruits are present.No neck pain distention present at 10 - 15 degrees. Thyroid normal to palpation  Heart rhythm and rate are normal without gallop .Grade 1/2-1 over 6 systolic murmur  Chest is clear with no increased work of breathing  There is no evidence of aortic aneurysm or renal artery bruits  Abdomen soft with no organomegaly or masses. No HJR  No clubbing, cyanosis or edema present.  Pedal pulses are intact   No ischemic skin changes are present . Fingernails healthy   Alert and oriented. Strength, tone, DTRs reflexes normal          Assessment & Plan:  #1 HTN  See orders

## 2014-05-02 NOTE — Patient Instructions (Signed)

## 2014-05-03 ENCOUNTER — Encounter: Payer: Self-pay | Admitting: Internal Medicine

## 2014-05-21 ENCOUNTER — Other Ambulatory Visit: Payer: Self-pay | Admitting: Obstetrics and Gynecology

## 2014-05-21 DIAGNOSIS — R928 Other abnormal and inconclusive findings on diagnostic imaging of breast: Secondary | ICD-10-CM

## 2014-06-02 ENCOUNTER — Ambulatory Visit
Admission: RE | Admit: 2014-06-02 | Discharge: 2014-06-02 | Disposition: A | Discharge status: Home / Self Care | Payer: 59 | Source: Ambulatory Visit | Attending: Obstetrics and Gynecology | Admitting: Obstetrics and Gynecology

## 2014-06-02 ENCOUNTER — Encounter (INDEPENDENT_AMBULATORY_CARE_PROVIDER_SITE_OTHER): Payer: Self-pay

## 2014-06-02 DIAGNOSIS — R928 Other abnormal and inconclusive findings on diagnostic imaging of breast: Secondary | ICD-10-CM

## 2014-06-17 ENCOUNTER — Other Ambulatory Visit: Payer: Self-pay

## 2014-06-17 MED ORDER — ROSUVASTATIN CALCIUM 20 MG PO TABS: ORAL_TABLET | ORAL | Status: DC

## 2014-06-25 ENCOUNTER — Other Ambulatory Visit: Payer: Self-pay

## 2014-06-25 MED ORDER — EZETIMIBE 10 MG PO TABS: ORAL_TABLET | ORAL | Status: DC

## 2014-10-24 ENCOUNTER — Other Ambulatory Visit: Payer: Self-pay | Admitting: Obstetrics and Gynecology

## 2014-10-24 DIAGNOSIS — R921 Mammographic calcification found on diagnostic imaging of breast: Secondary | ICD-10-CM

## 2014-11-02 ENCOUNTER — Other Ambulatory Visit: Payer: Self-pay | Admitting: Internal Medicine

## 2014-11-25 ENCOUNTER — Ambulatory Visit
Admission: RE | Admit: 2014-11-25 | Discharge: 2014-11-25 | Disposition: A | Discharge status: Home / Self Care | Payer: 59 | Source: Ambulatory Visit | Attending: Obstetrics and Gynecology | Admitting: Obstetrics and Gynecology

## 2014-11-25 DIAGNOSIS — R921 Mammographic calcification found on diagnostic imaging of breast: Secondary | ICD-10-CM

## 2015-01-12 ENCOUNTER — Other Ambulatory Visit: Payer: Self-pay

## 2015-01-12 MED ORDER — EZETIMIBE 10 MG PO TABS: ORAL_TABLET | ORAL | Status: DC

## 2015-02-04 ENCOUNTER — Other Ambulatory Visit: Payer: Self-pay | Admitting: Internal Medicine

## 2015-03-25 ENCOUNTER — Other Ambulatory Visit: Payer: Self-pay | Admitting: Obstetrics and Gynecology

## 2015-03-25 DIAGNOSIS — R921 Mammographic calcification found on diagnostic imaging of breast: Secondary | ICD-10-CM

## 2015-04-27 ENCOUNTER — Other Ambulatory Visit: Payer: Self-pay | Admitting: Obstetrics and Gynecology

## 2015-04-27 ENCOUNTER — Ambulatory Visit
Admission: RE | Admit: 2015-04-27 | Discharge: 2015-04-27 | Disposition: A | Discharge status: Home / Self Care | Payer: 59 | Source: Ambulatory Visit | Attending: Obstetrics and Gynecology | Admitting: Obstetrics and Gynecology

## 2015-04-27 DIAGNOSIS — R921 Mammographic calcification found on diagnostic imaging of breast: Secondary | ICD-10-CM

## 2015-04-28 ENCOUNTER — Encounter: Payer: Self-pay | Admitting: Internal Medicine

## 2015-04-28 ENCOUNTER — Ambulatory Visit (INDEPENDENT_AMBULATORY_CARE_PROVIDER_SITE_OTHER): Payer: 59 | Admitting: Internal Medicine

## 2015-04-28 VITALS — BP 118/76 | HR 87 | Temp 98.3°F | Resp 16 | Ht 60.0 in | Wt 185.0 lb

## 2015-04-28 DIAGNOSIS — Z0000 Encounter for general adult medical examination without abnormal findings: Secondary | ICD-10-CM | POA: Diagnosis not present

## 2015-04-28 NOTE — Progress Notes (Signed)
Subjective:    Patient ID: Sandra [name], female    DOB: 1951-09-08, 64 y.o.   MRN: [id number]  HPI  She is here for a physical;acute issues include concern over breast calcifications which [facility].Mammogram is current.  She does restrict red meat; she does eat some fried foods. She's been drinking a sixpack of beer a night. She walks every other day for at least 30 minutes without cardio pulmonary symptoms. Last week she did have palpitations intermittently 1 evening for hours. She does drink up to 32 ounces of coffee a day but denies other stimulants.  She has thinning of her hair which is chronic issue. She also describes cold intolerance but this is improved since she's gone through menopause.  Colonoscopic follow-up is due this year. She sees Dr Jeralene PetersGanim. Gynecologic care is current.  There is a history of possible myocardial infarction her maternal grandmother less than 62 years of age and her mother. Her mother had congestive heart failure. Her father had Alzheimer's and may have had a stroke.     Review of Systems  Chest pain,  tachycardia, exertional dyspnea, paroxysmal nocturnal dyspnea, claudication or edema are otherwise absent. No unexplained weight loss, abdominal pain, significant dyspepsia, dysphagia, melena, rectal bleeding, or persistently small caliber stools. Dysuria, pyuria, hematuria, frequency, nocturia or polyuria are denied. Change in skin or nails denied. No bowel changes of constipation or diarrhea.      Objective:   Physical Exam  Gen.: Adequately nourished in appearance. Alert, appropriate and cooperative throughout exam. BMI:36.13 Appears younger than stated age  Head: Normocephalic without obvious abnormalities; hair very fine without alopecia  Eyes: No corneal or conjunctival inflammation noted. Pupils equal round reactive to light and accommodation. Extraocular motion intact.  Ears: External  ear exam reveals no  significant lesions or deformities. Canals clear .TMs normal. Hearing is grossly normal bilaterally. Nose: External nasal exam reveals no deformity or inflammation. Nasal mucosa are pink and moist. No lesions or exudates noted.   Mouth: Oral mucosa and oropharynx reveal no lesions or exudates. Teeth in good repair. Neck: No deformities, masses, or tenderness noted. Range of motion is here very fine and Thyroid normal. Lungs: Normal respiratory effort; chest expands symmetrically. Lungs are clear to auscultation without rales, wheezes, or increased work of breathing. Heart: Normal rate and rhythm. Normal S1 and S2. No gallop, click, or rub. No murmur. Abdomen: Protuberant.Bowel sounds normal; abdomen soft and nontender. No masses, organomegaly or hernias noted. Genitalia: as per Gyn                                  Musculoskeletal/extremities: No deformity or scoliosis noted of  the thoracic or lumbar spine.  No clubbing, cyanosis, edema, or significant extremity  deformity noted.  Range of motion normal . Tone & strength normal. Hand joints normal  Fingernail  health good. Crepitus of knees.Pes planus. Able to lie down & sit up w/o help.  Negative SLR bilaterally Vascular: Carotid, radial artery, dorsalis pedis and  posterior tibial pulses are full and equal. No bruits present. Neurologic: Alert and oriented x3. Deep tendon reflexes symmetrical and normal.  Gait normal       Skin: Intact without suspicious lesions or rashes. Lymph: No cervical, axillary lymphadenopathy present. Psych: Mood and affect are normal. Normally interactive  Assessment & Plan:  #1 comprehensive physical exam; no acute findings  Plan: see Orders  & Recommendations

## 2015-04-28 NOTE — Patient Instructions (Signed)
  Your next office appointment will be determined based upon review of your pending labs .  Those written interpretation of the lab results and instructions will be transmitted to you by mail for your records.  Critical results will be called.   Followup as needed for any active or acute issue. Please report any significant change in your symptoms.To prevent palpitations or premature beats, avoid stimulants such as decongestants, diet pills, nicotine, or caffeine (coffee, tea, cola, or chocolate) to excess.

## 2015-04-28 NOTE — Progress Notes (Signed)
Pre visit review using our clinic review tool, if applicable. No additional management support is needed unless otherwise documented below in the visit note. 

## 2015-04-29 ENCOUNTER — Encounter: Payer: Self-pay | Admitting: Internal Medicine

## 2015-04-30 ENCOUNTER — Other Ambulatory Visit (INDEPENDENT_AMBULATORY_CARE_PROVIDER_SITE_OTHER): Payer: 59

## 2015-04-30 ENCOUNTER — Other Ambulatory Visit: Payer: Self-pay | Admitting: Internal Medicine

## 2015-04-30 DIAGNOSIS — Z0000 Encounter for general adult medical examination without abnormal findings: Secondary | ICD-10-CM

## 2015-04-30 LAB — CBC WITH DIFFERENTIAL/PLATELET
Basophils Absolute: 0.1 10*3/uL (ref 0.0–0.1)
Basophils Relative: 0.8 % (ref 0.0–3.0)
EOS ABS: 0.1 10*3/uL (ref 0.0–0.7)
Eosinophils Relative: 0.9 % (ref 0.0–5.0)
HCT: 41.9 % (ref 36.0–46.0)
Hemoglobin: 13.9 g/dL (ref 12.0–15.0)
LYMPHS PCT: 19.5 % (ref 12.0–46.0)
Lymphs Abs: 1.5 10*3/uL (ref 0.7–4.0)
MCHC: 33.3 g/dL (ref 30.0–36.0)
MCV: 93.7 fl (ref 78.0–100.0)
MONO ABS: 0.8 10*3/uL (ref 0.1–1.0)
Monocytes Relative: 10.4 % (ref 3.0–12.0)
Neutro Abs: 5.4 10*3/uL (ref 1.4–7.7)
Neutrophils Relative %: 68.4 % (ref 43.0–77.0)
Platelets: 272 10*3/uL (ref 150.0–400.0)
RBC: 4.47 Mil/uL (ref 3.87–5.11)
RDW: 14.8 % (ref 11.5–15.5)
WBC: 7.9 10*3/uL (ref 4.0–10.5)

## 2015-04-30 LAB — BASIC METABOLIC PANEL
BUN: 11 mg/dL (ref 6–23)
CO2: 27 meq/L (ref 19–32)
Calcium: 9.5 mg/dL (ref 8.4–10.5)
Chloride: 105 mEq/L (ref 96–112)
Creatinine, Ser: 0.92 mg/dL (ref 0.40–1.20)
GFR: 79.62 mL/min (ref >60.00)
Glucose, Bld: 92 mg/dL (ref 70–99)
POTASSIUM: 4 meq/L (ref 3.5–5.1)
SODIUM: 138 meq/L (ref 135–145)

## 2015-04-30 LAB — TSH: TSH: 1.94 u[IU]/mL (ref 0.35–4.50)

## 2015-04-30 LAB — HEPATIC FUNCTION PANEL
ALT: 23 U/L (ref 0–35)
AST: 24 U/L (ref 0–37)
Albumin: 4.3 g/dL (ref 3.5–5.2)
Alkaline Phosphatase: 77 U/L (ref 39–117)
Bilirubin, Direct: 0.1 mg/dL (ref 0.0–0.3)
TOTAL PROTEIN: 7.9 g/dL (ref 6.0–8.3)
Total Bilirubin: 0.6 mg/dL (ref 0.2–1.2)

## 2015-04-30 LAB — VITAMIN D 25 HYDROXY (VIT D DEFICIENCY, FRACTURES): VITD: 40.16 ng/mL (ref 30.00–100.00)

## 2015-05-02 LAB — NMR LIPOPROFILE WITH LIPIDS
Cholesterol, Total: 210 mg/dL — ABNORMAL HIGH (ref 100–199)
HDL Particle Number: 40 umol/L (ref >=30.5)
HDL SIZE: 9.5 nm (ref >=9.2)
HDL-C: 63 mg/dL (ref >39)
LARGE HDL: 5.6 umol/L (ref >=4.8)
LDL CALC: 127 mg/dL — AB (ref 0–99)
LDL PARTICLE NUMBER: 1770 nmol/L — AB (ref <1000)
LDL Size: 20.9 nm (ref >=20.8)
LP-IR Score: <25 (ref <=45)
Small LDL Particle Number: 889 nmol/L — ABNORMAL HIGH (ref <=527)
TRIGLYCERIDES: 99 mg/dL (ref 0–149)
VLDL Size: 38.7 nm (ref <=46.6)

## 2015-05-20 ENCOUNTER — Other Ambulatory Visit: Payer: Self-pay | Admitting: Internal Medicine

## 2015-05-20 ENCOUNTER — Other Ambulatory Visit: Payer: Self-pay

## 2015-05-20 MED ORDER — HYDROCHLOROTHIAZIDE 12.5 MG PO CAPS: 12.5000 mg | ORAL_CAPSULE | Freq: Every day | ORAL | Status: DC

## 2015-05-20 NOTE — Telephone Encounter (Signed)
hydrochlorithiazide rx sent to pharm 

## 2015-07-24 ENCOUNTER — Other Ambulatory Visit: Payer: Self-pay | Admitting: Internal Medicine

## 2015-08-08 ENCOUNTER — Other Ambulatory Visit: Payer: Self-pay | Admitting: Internal Medicine

## 2015-08-10 ENCOUNTER — Other Ambulatory Visit: Payer: Self-pay | Admitting: Emergency Medicine

## 2015-08-10 MED ORDER — ROSUVASTATIN CALCIUM 20 MG PO TABS: 20.0000 mg | ORAL_TABLET | Freq: Every day | ORAL | Status: DC

## 2015-09-22 ENCOUNTER — Other Ambulatory Visit: Payer: Self-pay | Admitting: Obstetrics and Gynecology

## 2015-09-22 DIAGNOSIS — R921 Mammographic calcification found on diagnostic imaging of breast: Secondary | ICD-10-CM

## 2015-10-29 ENCOUNTER — Ambulatory Visit
Admission: RE | Admit: 2015-10-29 | Discharge: 2015-10-29 | Disposition: A | Discharge status: Home / Self Care | Payer: Commercial Managed Care - HMO | Source: Ambulatory Visit | Attending: Obstetrics and Gynecology | Admitting: Obstetrics and Gynecology

## 2015-10-29 DIAGNOSIS — R921 Mammographic calcification found on diagnostic imaging of breast: Secondary | ICD-10-CM

## 2015-11-12 ENCOUNTER — Other Ambulatory Visit: Payer: Self-pay | Admitting: Internal Medicine

## 2016-01-27 ENCOUNTER — Other Ambulatory Visit: Payer: Self-pay | Admitting: Internal Medicine

## 2016-01-27 NOTE — Telephone Encounter (Signed)
Left message advising patient to call back to schedule appt/get established with new pcp---appt can be around June/2017---let Doreena Maulden know when appt is made so that refill for Zetia can be sent to patient's pharm

## 2016-01-28 NOTE — Telephone Encounter (Signed)
I have scheduled her appointment with Dr. Lawerance BachBurns.  Please refill prescription

## 2016-01-29 NOTE — Telephone Encounter (Signed)
Sent enough until appt 4/225/17...Raechel Chute/lmb

## 2016-02-09 ENCOUNTER — Other Ambulatory Visit: Payer: Self-pay | Admitting: Internal Medicine

## 2016-03-03 ENCOUNTER — Other Ambulatory Visit: Payer: Self-pay

## 2016-03-03 MED ORDER — HYDROCHLOROTHIAZIDE 12.5 MG PO CAPS: 12.5000 mg | ORAL_CAPSULE | Freq: Every day | ORAL | Status: DC

## 2016-03-06 ENCOUNTER — Other Ambulatory Visit: Payer: Self-pay | Admitting: Internal Medicine

## 2016-03-15 ENCOUNTER — Encounter: Payer: Self-pay | Admitting: Internal Medicine

## 2016-03-15 ENCOUNTER — Ambulatory Visit (INDEPENDENT_AMBULATORY_CARE_PROVIDER_SITE_OTHER): Payer: Commercial Managed Care - HMO | Admitting: Internal Medicine

## 2016-03-15 VITALS — BP 160/62 | HR 88 | Temp 98.2°F | Resp 16 | Wt 180.0 lb

## 2016-03-15 DIAGNOSIS — E785 Hyperlipidemia, unspecified: Secondary | ICD-10-CM | POA: Diagnosis not present

## 2016-03-15 DIAGNOSIS — I10 Essential (primary) hypertension: Secondary | ICD-10-CM | POA: Insufficient documentation

## 2016-03-15 DIAGNOSIS — R600 Localized edema: Secondary | ICD-10-CM

## 2016-03-15 DIAGNOSIS — Z114 Encounter for screening for human immunodeficiency virus [HIV]: Secondary | ICD-10-CM

## 2016-03-15 DIAGNOSIS — Z1159 Encounter for screening for other viral diseases: Secondary | ICD-10-CM | POA: Diagnosis not present

## 2016-03-15 MED ORDER — EZETIMIBE 10 MG PO TABS: 10.0000 mg | ORAL_TABLET | Freq: Every day | ORAL | Status: DC

## 2016-03-15 MED ORDER — ROSUVASTATIN CALCIUM 20 MG PO TABS: ORAL_TABLET | ORAL | Status: DC

## 2016-03-15 MED ORDER — HYDROCHLOROTHIAZIDE 12.5 MG PO CAPS: 12.5000 mg | ORAL_CAPSULE | Freq: Every day | ORAL | Status: DC

## 2016-03-15 NOTE — Progress Notes (Signed)
Subjective:    Patient ID: Alinda Sierras, female    DOB: 1950/11/26, 65 y.o.   MRN: [id number]  HPI She is here to establish with a new pcp.   Edema: She is taking her medication daily. She is sometimes compliant with a low sodium diet.  Her leg swelling is fairly controlled.    Hyperlipidemia: She is taking her medication daily. She is not compliant with a low fat/cholesterol diet. She is walking her dog for exercise, but does not other exercise.  She denies myalgias.  She wonders if she could stop one of her medications.     Medications and allergies reviewed with patient and updated if appropriate.  Patient Active Problem List   Diagnosis Date Noted  . Obesity (BMI 30-39.9) 05/02/2014  . Edema 09/16/2013  . Unspecified vitamin D deficiency 03/01/2013  . JOINT EFFUSION, LEFT KNEE 03/30/2010  . OSTEOPENIA 03/30/2010  . Hyperlipidemia 03/17/2008    Current Outpatient Prescriptions on File Prior to Visit  Medication Sig Dispense Refill  . aspirin 81 MG tablet Take 81 mg by mouth daily.      . calcium carbonate 200 MG capsule Take 250 mg by mouth 2 (two) times daily with a meal.    . ezetimibe (ZETIA) 10 MG tablet Take 1 tablet (10 mg total) by mouth daily. Must keep 03/15/16 appt for future refills 30 tablet 1  . hydrochlorothiazide (MICROZIDE) 12.5 MG capsule Take 1 capsule (12.5 mg total) by mouth daily. --- Please establish with new PCP for further refills 90 capsule 1  . rosuvastatin (CRESTOR) 20 MG tablet TAKE 1 TABLET (20 MG TOTAL) BY MOUTH DAILY. 30 tablet 0   No current facility-administered medications on file prior to visit.    Past Medical History  Diagnosis Date  . Hyperlipidemia   . Osteopenia     ??    Past Surgical History  Procedure Laterality Date  . G0p0    . Wisdom tooth extraction    . Colonoscopy      Dr Jeralene Peters    Social History   Social History  . Marital Status: Divorced    Spouse Name: N/A  . Number of Children: N/A  . Years of  Education: N/A   Occupational History  . 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 Smoking status: Never Smoker   . Smokeless tobacco: Not on file  . Alcohol Use: 25.2 oz/week    42 Cans of beer per week     Comment: socially  . Drug Use: No  . Sexual Activity: Not on file   Other Topics Concern  . Not on file   Social History Narrative    Family History  Problem Relation Age of Onset  . Heart disease Mother     irreg heart rate & heart failure  . Stroke Father     Alsheimer's  . Hyperlipidemia Sister   . Hyperlipidemia Brother   . Cancer Paternal Aunt     X 2 had breast cncer; 1 aunt cervical cancer  . Diabetes Other     mat. & pat sides  . Hypertension Other     mat & pat sides  . Heart attack Maternal Grandmother     < 65    Review of Systems  Constitutional: Negative for fever and chills.  Respiratory: Negative for cough, shortness of breath and wheezing.   Cardiovascular: Positive for palpitations (rare) and leg swelling. Negative for chest  pain.  Neurological: Negative for dizziness, light-headedness and headaches.       Objective:   Filed Vitals:   03/15/16 1538  BP: 160/62  Pulse: 88  Temp: 98.2 F (36.8 C)  Resp: 16   Filed Weights   03/15/16 1538  Weight: 180 lb (81.647 kg)   Body mass index is 35.15 kg/(m^2).   Physical Exam Constitutional: Appears well-developed and well-nourished. No distress.  Neck: Neck supple. No tracheal deviation present. No thyromegaly present.  No carotid bruit. No cervical adenopathy.   Cardiovascular: Normal rate, regular rhythm and normal heart sounds.   No murmur heard.  No edema Pulmonary/Chest: Effort normal and breath sounds normal. No respiratory distress. No wheezes.        Assessment & Plan:   See Problem List for Assessment and Plan of chronic medical problems.  F/u annually for a PE Follow up annually for a PE

## 2016-03-15 NOTE — Progress Notes (Signed)
Pre visit review using our clinic review tool, if applicable. No additional management support is needed unless otherwise documented below in the visit note. 

## 2016-03-15 NOTE — Assessment & Plan Note (Signed)
Check lipid panel Improve diet Work on weight loss I do not think she can decrease her medication with her current lifestyle, but possibly with changes in eating, exercise and weight loss

## 2016-03-15 NOTE — Patient Instructions (Addendum)
  Test(s) ordered today. Your results will be released to MyChart (or called to you) after review, usually within 72hours after test completion. If any changes need to be made, you will be notified at that same time.  All other Health Maintenance issues reviewed.   All recommended immunizations and age-appropriate screenings are up-to-date or discussed.  No immunizations administered today.   Medications reviewed and updated.  No changes recommended at this time.  Your prescription(s) have been submitted to your pharmacy. Please take as directed and contact our office if you believe you are having problem(s) with the medication(s).  Please followup in one year for a physical   

## 2016-03-15 NOTE — Assessment & Plan Note (Signed)
Controlled Check cmp Continue hctz 12. 5 mg daily Decrease sodium intake Work on weight loss

## 2016-03-21 ENCOUNTER — Other Ambulatory Visit (INDEPENDENT_AMBULATORY_CARE_PROVIDER_SITE_OTHER): Payer: Commercial Managed Care - HMO

## 2016-03-21 DIAGNOSIS — E785 Hyperlipidemia, unspecified: Secondary | ICD-10-CM

## 2016-03-21 DIAGNOSIS — R600 Localized edema: Secondary | ICD-10-CM | POA: Diagnosis not present

## 2016-03-21 DIAGNOSIS — Z1159 Encounter for screening for other viral diseases: Secondary | ICD-10-CM

## 2016-03-21 DIAGNOSIS — Z114 Encounter for screening for human immunodeficiency virus [HIV]: Secondary | ICD-10-CM

## 2016-03-21 LAB — CBC WITH DIFFERENTIAL/PLATELET
BASOS ABS: 0 10*3/uL (ref 0.0–0.1)
Basophils Relative: 0.5 % (ref 0.0–3.0)
EOS ABS: 0.1 10*3/uL (ref 0.0–0.7)
Eosinophils Relative: 0.6 % (ref 0.0–5.0)
HCT: 42 % (ref 36.0–46.0)
Hemoglobin: 14.2 g/dL (ref 12.0–15.0)
LYMPHS ABS: 2.1 10*3/uL (ref 0.7–4.0)
LYMPHS PCT: 22.1 % (ref 12.0–46.0)
MCHC: 33.8 g/dL (ref 30.0–36.0)
MCV: 92.8 fl (ref 78.0–100.0)
Monocytes Absolute: 0.9 10*3/uL (ref 0.1–1.0)
Monocytes Relative: 9.6 % (ref 3.0–12.0)
NEUTROS ABS: 6.4 10*3/uL (ref 1.4–7.7)
NEUTROS PCT: 67.2 % (ref 43.0–77.0)
PLATELETS: 248 10*3/uL (ref 150.0–400.0)
RBC: 4.53 Mil/uL (ref 3.87–5.11)
RDW: 14.6 % (ref 11.5–15.5)
WBC: 9.5 10*3/uL (ref 4.0–10.5)

## 2016-03-21 LAB — COMPREHENSIVE METABOLIC PANEL
ALBUMIN: 4.5 g/dL (ref 3.5–5.2)
ALT: 18 U/L (ref 0–35)
AST: 21 U/L (ref 0–37)
Alkaline Phosphatase: 58 U/L (ref 39–117)
BUN: 13 mg/dL (ref 6–23)
CALCIUM: 10.3 mg/dL (ref 8.4–10.5)
CHLORIDE: 101 meq/L (ref 96–112)
CO2: 28 meq/L (ref 19–32)
CREATININE: 0.88 mg/dL (ref 0.40–1.20)
GFR: 83.57 mL/min (ref >60.00)
Glucose, Bld: 89 mg/dL (ref 70–99)
Potassium: 4.1 mEq/L (ref 3.5–5.1)
Sodium: 139 mEq/L (ref 135–145)
Total Bilirubin: 0.7 mg/dL (ref 0.2–1.2)
Total Protein: 8.2 g/dL (ref 6.0–8.3)

## 2016-03-21 LAB — TSH: TSH: 2.25 u[IU]/mL (ref 0.35–4.50)

## 2016-03-21 LAB — LIPID PANEL
CHOL/HDL RATIO: 3
Cholesterol: 255 mg/dL — ABNORMAL HIGH (ref 0–200)
HDL: 87.3 mg/dL (ref >39.00)
LDL CALC: 150 mg/dL — AB (ref 0–99)
NonHDL: 167.94
TRIGLYCERIDES: 91 mg/dL (ref 0.0–149.0)
VLDL: 18.2 mg/dL (ref 0.0–40.0)

## 2016-03-21 LAB — HEPATITIS C ANTIBODY: HCV AB: NEGATIVE

## 2016-03-21 LAB — HIV ANTIBODY (ROUTINE TESTING W REFLEX): HIV: NONREACTIVE

## 2016-03-31 ENCOUNTER — Encounter: Payer: Self-pay | Admitting: Emergency Medicine

## 2016-06-01 ENCOUNTER — Other Ambulatory Visit: Payer: Self-pay | Admitting: Obstetrics and Gynecology

## 2016-06-01 DIAGNOSIS — R921 Mammographic calcification found on diagnostic imaging of breast: Secondary | ICD-10-CM

## 2016-06-05 ENCOUNTER — Emergency Department (HOSPITAL_COMMUNITY): Payer: Commercial Managed Care - HMO

## 2016-06-05 ENCOUNTER — Encounter (HOSPITAL_COMMUNITY): Payer: Self-pay

## 2016-06-05 ENCOUNTER — Emergency Department (HOSPITAL_COMMUNITY)
Admission: EM | Admit: 2016-06-05 | Discharge: 2016-06-05 | Disposition: A | Discharge status: Home / Self Care | Payer: Commercial Managed Care - HMO | Attending: Emergency Medicine | Admitting: Emergency Medicine

## 2016-06-05 DIAGNOSIS — Z79899 Other long term (current) drug therapy: Secondary | ICD-10-CM | POA: Diagnosis not present

## 2016-06-05 DIAGNOSIS — Y92410 Unspecified street and highway as the place of occurrence of the external cause: Secondary | ICD-10-CM | POA: Insufficient documentation

## 2016-06-05 DIAGNOSIS — Y999 Unspecified external cause status: Secondary | ICD-10-CM | POA: Insufficient documentation

## 2016-06-05 DIAGNOSIS — S29001A Unspecified injury of muscle and tendon of front wall of thorax, initial encounter: Secondary | ICD-10-CM | POA: Diagnosis present

## 2016-06-05 DIAGNOSIS — Y939 Activity, unspecified: Secondary | ICD-10-CM | POA: Diagnosis not present

## 2016-06-05 DIAGNOSIS — S20212A Contusion of left front wall of thorax, initial encounter: Secondary | ICD-10-CM | POA: Diagnosis not present

## 2016-06-05 DIAGNOSIS — R102 Pelvic and perineal pain: Secondary | ICD-10-CM | POA: Insufficient documentation

## 2016-06-05 DIAGNOSIS — S8001XA Contusion of right knee, initial encounter: Secondary | ICD-10-CM | POA: Insufficient documentation

## 2016-06-05 DIAGNOSIS — R079 Chest pain, unspecified: Secondary | ICD-10-CM | POA: Diagnosis not present

## 2016-06-05 DIAGNOSIS — Z7982 Long term (current) use of aspirin: Secondary | ICD-10-CM | POA: Diagnosis not present

## 2016-06-05 DIAGNOSIS — E785 Hyperlipidemia, unspecified: Secondary | ICD-10-CM | POA: Insufficient documentation

## 2016-06-05 DIAGNOSIS — S20219A Contusion of unspecified front wall of thorax, initial encounter: Secondary | ICD-10-CM

## 2016-06-05 LAB — I-STAT CHEM 8, ED
BUN: 19 mg/dL (ref 6–20)
CALCIUM ION: 1.1 mmol/L — AB (ref 1.12–1.23)
CREATININE: 1 mg/dL (ref 0.44–1.00)
Chloride: 105 mmol/L (ref 101–111)
GLUCOSE: 108 mg/dL — AB (ref 65–99)
HCT: 41 % (ref 36.0–46.0)
HEMOGLOBIN: 13.9 g/dL (ref 12.0–15.0)
Potassium: 3.3 mmol/L — ABNORMAL LOW (ref 3.5–5.1)
Sodium: 142 mmol/L (ref 135–145)
TCO2: 25 mmol/L (ref 0–100)

## 2016-06-05 LAB — I-STAT TROPONIN, ED: TROPONIN I, POC: 0 ng/mL (ref 0.00–0.08)

## 2016-06-05 LAB — COMPREHENSIVE METABOLIC PANEL
ALT: 26 U/L (ref 14–54)
ANION GAP: 9 (ref 5–15)
AST: 34 U/L (ref 15–41)
Albumin: 4 g/dL (ref 3.5–5.0)
Alkaline Phosphatase: 64 U/L (ref 38–126)
BUN: 19 mg/dL (ref 6–20)
CHLORIDE: 106 mmol/L (ref 101–111)
CO2: 25 mmol/L (ref 22–32)
CREATININE: 1.03 mg/dL — AB (ref 0.44–1.00)
Calcium: 9.2 mg/dL (ref 8.9–10.3)
GFR, EST NON AFRICAN AMERICAN: 57 mL/min — AB (ref >60)
Glucose, Bld: 109 mg/dL — ABNORMAL HIGH (ref 65–99)
Potassium: 3.2 mmol/L — ABNORMAL LOW (ref 3.5–5.1)
SODIUM: 140 mmol/L (ref 135–145)
Total Bilirubin: 0.7 mg/dL (ref 0.3–1.2)
Total Protein: 7.9 g/dL (ref 6.5–8.1)

## 2016-06-05 LAB — CBC
HCT: 38.4 % (ref 36.0–46.0)
HEMOGLOBIN: 12.9 g/dL (ref 12.0–15.0)
MCH: 31.6 pg (ref 26.0–34.0)
MCHC: 33.6 g/dL (ref 30.0–36.0)
MCV: 94.1 fL (ref 78.0–100.0)
PLATELETS: 243 10*3/uL (ref 150–400)
RBC: 4.08 MIL/uL (ref 3.87–5.11)
RDW: 14.4 % (ref 11.5–15.5)
WBC: 12.9 10*3/uL — AB (ref 4.0–10.5)

## 2016-06-05 LAB — PROTIME-INR
INR: 1.08 (ref 0.00–1.49)
Prothrombin Time: 13.8 seconds (ref 11.6–15.2)

## 2016-06-05 LAB — I-STAT CG4 LACTIC ACID, ED: LACTIC ACID, VENOUS: 0.96 mmol/L (ref 0.5–1.9)

## 2016-06-05 MED ORDER — IOPAMIDOL (ISOVUE-300) INJECTION 61%
100.0000 mL | Freq: Once | INTRAVENOUS | Status: AC | PRN
  Administered 2016-06-05: 100 mL via INTRAVENOUS
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
## 2016-06-05 NOTE — ED Notes (Signed)
Pt ambulated in hallway with RN. Pt tolerated well.

## 2016-06-05 NOTE — ED Notes (Signed)
Bed: WA09 Expected date:  Expected time:  Means of arrival:  Comments: RM #[id number]

## 2016-06-05 NOTE — ED Notes (Signed)
Patient transported to CT 
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
## 2016-06-16 ENCOUNTER — Ambulatory Visit (INDEPENDENT_AMBULATORY_CARE_PROVIDER_SITE_OTHER): Payer: Commercial Managed Care - HMO | Admitting: Internal Medicine

## 2016-06-16 ENCOUNTER — Encounter: Payer: Self-pay | Admitting: Internal Medicine

## 2016-06-16 VITALS — BP 112/76 | HR 79 | Temp 98.4°F | Resp 16 | Ht 60.0 in | Wt 177.0 lb

## 2016-06-16 DIAGNOSIS — S20219D Contusion of unspecified front wall of thorax, subsequent encounter: Secondary | ICD-10-CM

## 2016-06-16 DIAGNOSIS — M25469 Effusion, unspecified knee: Secondary | ICD-10-CM | POA: Insufficient documentation

## 2016-06-16 DIAGNOSIS — M25461 Effusion, right knee: Secondary | ICD-10-CM

## 2016-06-16 DIAGNOSIS — S20219A Contusion of unspecified front wall of thorax, initial encounter: Secondary | ICD-10-CM | POA: Insufficient documentation

## 2016-06-16 NOTE — Assessment & Plan Note (Signed)
Right knee - minimal pain, mild bruising, moderate swelling Has not needed any pain medication Symptoms improving No treatment needed I would expect her symptoms to continue to improve and she will let me know if they do not

## 2016-06-16 NOTE — Assessment & Plan Note (Signed)
Results in bruising on chest/breasts, and right knee swelling, pain and bruising All symptoms improving without treatment
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
## 2016-06-16 NOTE — Patient Instructions (Addendum)
  Medications reviewed and updated.   No changes recommended at this time.  If you have any concerning symptoms including chest pain, shortness of breath or fever please call.   If your symptoms do not improve or worsen please call or return.

## 2016-06-16 NOTE — Progress Notes (Signed)
Pre visit review using our clinic review tool, if applicable. No additional management support is needed unless otherwise documented below in the visit note. 

## 2016-07-06 ENCOUNTER — Ambulatory Visit
Admission: RE | Admit: 2016-07-06 | Discharge: 2016-07-06 | Disposition: A | Discharge status: Home / Self Care | Payer: Commercial Managed Care - HMO | Source: Ambulatory Visit | Attending: Obstetrics and Gynecology | Admitting: Obstetrics and Gynecology

## 2016-07-06 DIAGNOSIS — R921 Mammographic calcification found on diagnostic imaging of breast: Secondary | ICD-10-CM

## 2016-08-11 ENCOUNTER — Other Ambulatory Visit: Payer: Self-pay | Admitting: Obstetrics and Gynecology

## 2016-08-11 DIAGNOSIS — N63 Unspecified lump in unspecified breast: Secondary | ICD-10-CM

## 2016-08-16 ENCOUNTER — Ambulatory Visit
Admission: RE | Admit: 2016-08-16 | Discharge: 2016-08-16 | Disposition: A | Discharge status: Home / Self Care | Payer: Commercial Managed Care - HMO | Source: Ambulatory Visit | Attending: Obstetrics and Gynecology | Admitting: Obstetrics and Gynecology

## 2016-08-16 DIAGNOSIS — N63 Unspecified lump in unspecified breast: Secondary | ICD-10-CM

## 2017-03-21 ENCOUNTER — Encounter: Payer: Self-pay | Admitting: Internal Medicine

## 2017-03-21 ENCOUNTER — Ambulatory Visit (INDEPENDENT_AMBULATORY_CARE_PROVIDER_SITE_OTHER): Payer: Commercial Managed Care - HMO | Admitting: Internal Medicine

## 2017-03-21 VITALS — BP 104/68 | HR 97 | Temp 98.2°F | Resp 16 | Ht 61.0 in | Wt 174.0 lb

## 2017-03-21 DIAGNOSIS — E7800 Pure hypercholesterolemia, unspecified: Secondary | ICD-10-CM

## 2017-03-21 DIAGNOSIS — Z0000 Encounter for general adult medical examination without abnormal findings: Secondary | ICD-10-CM

## 2017-03-21 DIAGNOSIS — E669 Obesity, unspecified: Secondary | ICD-10-CM

## 2017-03-21 DIAGNOSIS — M8580 Other specified disorders of bone density and structure, unspecified site: Secondary | ICD-10-CM | POA: Diagnosis not present

## 2017-03-21 DIAGNOSIS — E559 Vitamin D deficiency, unspecified: Secondary | ICD-10-CM | POA: Diagnosis not present

## 2017-03-21 DIAGNOSIS — R739 Hyperglycemia, unspecified: Secondary | ICD-10-CM

## 2017-03-21 NOTE — Assessment & Plan Note (Signed)
Advised weight loss Increase exercise

## 2017-03-21 NOTE — Assessment & Plan Note (Signed)
History of elevated sugars Check a1c Low sugar / carb diet Stressed regular exercise, weight loss

## 2017-03-21 NOTE — Assessment & Plan Note (Signed)
Taking crestor and zetia Check lipid panel, cmp Stressed regular exercise, weight loss and healthy diet

## 2017-03-21 NOTE — Assessment & Plan Note (Signed)
dexa up to date - monitored by Dr Arelia Sneddon Taking cal/vit d Stressed regular exercise

## 2017-03-21 NOTE — Patient Instructions (Addendum)
Test(s) ordered today. Your results will be released to MyChart (or called to you) after review, usually within 72hours after test completion. If any changes need to be made, you will be notified at that same time.  All other Health Maintenance issues reviewed.   All recommended immunizations and age-appropriate screenings are up-to-date or discussed.  No immunizations administered today.   Medications reviewed and updated.  No changes recommended at this time.  Your prescription(s) have been submitted to your pharmacy. Please take as directed and contact our office if you believe you are having problem(s) with the medication(s).  Please followup in one year for a physical   Health Maintenance, Female Adopting a healthy lifestyle and getting preventive care can go a long way to promote health and wellness. Talk with your health care provider about what schedule of regular examinations is right for you. This is a good chance for you to check in with your provider about disease prevention and staying healthy. In between checkups, there are plenty of things you can do on your own. Experts have done a lot of research about which lifestyle changes and preventive measures are most likely to keep you healthy. Ask your health care provider for more information. Weight and diet Eat a healthy diet  Be sure to include plenty of vegetables, fruits, low-fat dairy products, and lean protein.  Do not eat a lot of foods high in solid fats, added sugars, or salt.  Get regular exercise. This is one of the most important things you can do for your health.  Most adults should exercise for at least 150 minutes each week. The exercise should increase your heart rate and make you sweat (moderate-intensity exercise).  Most adults should also do strengthening exercises at least twice a week. This is in addition to the moderate-intensity exercise. Maintain a healthy weight  Body mass index (BMI) is a  measurement that can be used to identify possible weight problems. It estimates body fat based on height and weight. Your health care provider can help determine your BMI and help you achieve or maintain a healthy weight.  For females 20 years of age and older:  A BMI below 18.5 is considered underweight.  A BMI of 18.5 to 24.9 is normal.  A BMI of 25 to 29.9 is considered overweight.  A BMI of 30 and above is considered obese. Watch levels of cholesterol and blood lipids  You should start having your blood tested for lipids and cholesterol at 64 years of age, then have this test every 5 years.  You may need to have your cholesterol levels checked more often if:  Your lipid or cholesterol levels are high.  You are older than 64 years of age.  You are at high risk for heart disease. Cancer screening Lung Cancer  Lung cancer screening is recommended for adults 55-80 years old who are at high risk for lung cancer because of a history of smoking.  A yearly low-dose CT scan of the lungs is recommended for people who:  Currently smoke.  Have quit within the past 15 years.  Have at least a 30-pack-year history of smoking. A pack year is smoking an average of one pack of cigarettes a day for 1 year.  Yearly screening should continue until it has been 15 years since you quit.  Yearly screening should stop if you develop a health problem that would prevent you from having lung cancer treatment. Breast Cancer  Practice breast self-awareness. This means   understanding how your breasts normally appear and feel.  It also means doing regular breast self-exams. Let your health care provider know about any changes, no matter how small.  If you are in your 20s or 30s, you should have a clinical breast exam (CBE) by a health care provider every 1-3 years as part of a regular health exam.  If you are 40 or older, have a CBE every year. Also consider having a breast X-ray (mammogram) every  year.  If you have a family history of breast cancer, talk to your health care provider about genetic screening.  If you are at high risk for breast cancer, talk to your health care provider about having an MRI and a mammogram every year.  Breast cancer gene (BRCA) assessment is recommended for women who have family members with BRCA-related cancers. BRCA-related cancers include:  Breast.  Ovarian.  Tubal.  Peritoneal cancers.  Results of the assessment will determine the need for genetic counseling and BRCA1 and BRCA2 testing. Cervical Cancer  Your health care provider may recommend that you be screened regularly for cancer of the pelvic organs (ovaries, uterus, and vagina). This screening involves a pelvic examination, including checking for microscopic changes to the surface of your cervix (Pap test). You may be encouraged to have this screening done every 3 years, beginning at age 21.  For women ages 30-65, health care providers may recommend pelvic exams and Pap testing every 3 years, or they may recommend the Pap and pelvic exam, combined with testing for human papilloma virus (HPV), every 5 years. Some types of HPV increase your risk of cervical cancer. Testing for HPV may also be done on women of any age with unclear Pap test results.  Other health care providers may not recommend any screening for nonpregnant women who are considered low risk for pelvic cancer and who do not have symptoms. Ask your health care provider if a screening pelvic exam is right for you.  If you have had past treatment for cervical cancer or a condition that could lead to cancer, you need Pap tests and screening for cancer for at least 20 years after your treatment. If Pap tests have been discontinued, your risk factors (such as having a new sexual partner) need to be reassessed to determine if screening should resume. Some women have medical problems that increase the chance of getting cervical cancer. In  these cases, your health care provider may recommend more frequent screening and Pap tests. Colorectal Cancer  This type of cancer can be detected and often prevented.  Routine colorectal cancer screening usually begins at 64 years of age and continues through 64 years of age.  Your health care provider may recommend screening at an earlier age if you have risk factors for colon cancer.  Your health care provider may also recommend using home test kits to check for hidden blood in the stool.  A small camera at the end of a tube can be used to examine your colon directly (sigmoidoscopy or colonoscopy). This is done to check for the earliest forms of colorectal cancer.  Routine screening usually begins at age 50.  Direct examination of the colon should be repeated every 5-10 years through 64 years of age. However, you may need to be screened more often if early forms of precancerous polyps or small growths are found. Skin Cancer  Check your skin from head to toe regularly.  Tell your health care provider about any new moles or changes   in moles, especially if there is a change in a mole's shape or color.  Also tell your health care provider if you have a mole that is larger than the size of a pencil eraser.  Always use sunscreen. Apply sunscreen liberally and repeatedly throughout the day.  Protect yourself by wearing long sleeves, pants, a wide-brimmed hat, and sunglasses whenever you are outside. Heart disease, diabetes, and high blood pressure  High blood pressure causes heart disease and increases the risk of stroke. High blood pressure is more likely to develop in:  People who have blood pressure in the high end of the normal range (130-139/85-89 mm Hg).  People who are overweight or obese.  People who are African American.  If you are 18-39 years of age, have your blood pressure checked every 3-5 years. If you are 40 years of age or older, have your blood pressure checked  every year. You should have your blood pressure measured twice-once when you are at a hospital or clinic, and once when you are not at a hospital or clinic. Record the average of the two measurements. To check your blood pressure when you are not at a hospital or clinic, you can use:  An automated blood pressure machine at a pharmacy.  A home blood pressure monitor.  If you are between 55 years and 79 years old, ask your health care provider if you should take aspirin to prevent strokes.  Have regular diabetes screenings. This involves taking a blood sample to check your fasting blood sugar level.  If you are at a normal weight and have a low risk for diabetes, have this test once every three years after 64 years of age.  If you are overweight and have a high risk for diabetes, consider being tested at a younger age or more often. Preventing infection Hepatitis B  If you have a higher risk for hepatitis B, you should be screened for this virus. You are considered at high risk for hepatitis B if:  You were born in a country where hepatitis B is common. Ask your health care provider which countries are considered high risk.  Your parents were born in a high-risk country, and you have not been immunized against hepatitis B (hepatitis B vaccine).  You have HIV or AIDS.  You use needles to inject street drugs.  You live with someone who has hepatitis B.  You have had sex with someone who has hepatitis B.  You get hemodialysis treatment.  You take certain medicines for conditions, including cancer, organ transplantation, and autoimmune conditions. Hepatitis C  Blood testing is recommended for:  Everyone born from 1945 through 1965.  Anyone with known risk factors for hepatitis C. Sexually transmitted infections (STIs)  You should be screened for sexually transmitted infections (STIs) including gonorrhea and chlamydia if:  You are sexually active and are younger than 64 years of  age.  You are older than 64 years of age and your health care provider tells you that you are at risk for this type of infection.  Your sexual activity has changed since you were last screened and you are at an increased risk for chlamydia or gonorrhea. Ask your health care provider if you are at risk.  If you do not have HIV, but are at risk, it may be recommended that you take a prescription medicine daily to prevent HIV infection. This is called pre-exposure prophylaxis (PrEP). You are considered at risk if:  You are sexually active   and do not regularly use condoms or know the HIV status of your partner(s).  You take drugs by injection.  You are sexually active with a partner who has HIV. Talk with your health care provider about whether you are at high risk of being infected with HIV. If you choose to begin PrEP, you should first be tested for HIV. You should then be tested every 3 months for as long as you are taking PrEP. Pregnancy  If you are premenopausal and you may become pregnant, ask your health care provider about preconception counseling.  If you may become pregnant, take 400 to 800 micrograms (mcg) of folic acid every day.  If you want to prevent pregnancy, talk to your health care provider about birth control (contraception). Osteoporosis and menopause  Osteoporosis is a disease in which the bones lose minerals and strength with aging. This can result in serious bone fractures. Your risk for osteoporosis can be identified using a bone density scan.  If you are 65 years of age or older, or if you are at risk for osteoporosis and fractures, ask your health care provider if you should be screened.  Ask your health care provider whether you should take a calcium or vitamin D supplement to lower your risk for osteoporosis.  Menopause may have certain physical symptoms and risks.  Hormone replacement therapy may reduce some of these symptoms and risks. Talk to your health  care provider about whether hormone replacement therapy is right for you. Follow these instructions at home:  Schedule regular health, dental, and eye exams.  Stay current with your immunizations.  Do not use any tobacco products including cigarettes, chewing tobacco, or electronic cigarettes.  If you are pregnant, do not drink alcohol.  If you are breastfeeding, limit how much and how often you drink alcohol.  Limit alcohol intake to no more than 1 drink per day for nonpregnant women. One drink equals 12 ounces of beer, 5 ounces of wine, or 1 ounces of hard liquor.  Do not use street drugs.  Do not share needles.  Ask your health care provider for help if you need support or information about quitting drugs.  Tell your health care provider if you often feel depressed.  Tell your health care provider if you have ever been abused or do not feel safe at home. This information is not intended to replace advice given to you by your health care provider. Make sure you discuss any questions you have with your health care provider. Document Released: 05/23/2011 Document Revised: 04/14/2016 Document Reviewed: 08/11/2015 Elsevier Interactive Patient Education  2017 [employer]  

## 2017-03-21 NOTE — Assessment & Plan Note (Signed)
Taking vitamin D daily Check level given osteopenia

## 2017-03-21 NOTE — Progress Notes (Signed)
Pre visit review using our clinic review tool, if applicable. No additional management support is needed unless otherwise documented below in the visit note. 
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
## 2017-03-22 ENCOUNTER — Other Ambulatory Visit (INDEPENDENT_AMBULATORY_CARE_PROVIDER_SITE_OTHER): Payer: Commercial Managed Care - HMO

## 2017-03-22 DIAGNOSIS — R739 Hyperglycemia, unspecified: Secondary | ICD-10-CM

## 2017-03-22 DIAGNOSIS — E7800 Pure hypercholesterolemia, unspecified: Secondary | ICD-10-CM | POA: Diagnosis not present

## 2017-03-22 DIAGNOSIS — Z0000 Encounter for general adult medical examination without abnormal findings: Secondary | ICD-10-CM | POA: Diagnosis not present

## 2017-03-22 DIAGNOSIS — M8580 Other specified disorders of bone density and structure, unspecified site: Secondary | ICD-10-CM | POA: Diagnosis not present

## 2017-03-22 DIAGNOSIS — E559 Vitamin D deficiency, unspecified: Secondary | ICD-10-CM

## 2017-03-22 LAB — COMPREHENSIVE METABOLIC PANEL
ALBUMIN: 4.1 g/dL (ref 3.5–5.2)
ALK PHOS: 69 U/L (ref 39–117)
ALT: 20 U/L (ref 0–35)
AST: 22 U/L (ref 0–37)
BILIRUBIN TOTAL: 0.4 mg/dL (ref 0.2–1.2)
BUN: 8 mg/dL (ref 6–23)
CALCIUM: 9.5 mg/dL (ref 8.4–10.5)
CO2: 27 mEq/L (ref 19–32)
CREATININE: 0.86 mg/dL (ref 0.40–1.20)
Chloride: 105 mEq/L (ref 96–112)
GFR: 85.54 mL/min (ref >60.00)
Glucose, Bld: 87 mg/dL (ref 70–99)
Potassium: 4.1 mEq/L (ref 3.5–5.1)
SODIUM: 140 meq/L (ref 135–145)
TOTAL PROTEIN: 7.6 g/dL (ref 6.0–8.3)

## 2017-03-22 LAB — CBC WITH DIFFERENTIAL/PLATELET
Basophils Absolute: 0.1 10*3/uL (ref 0.0–0.1)
Basophils Relative: 1.3 % (ref 0.0–3.0)
EOS ABS: 0.2 10*3/uL (ref 0.0–0.7)
EOS PCT: 2.5 % (ref 0.0–5.0)
HEMATOCRIT: 41.2 % (ref 36.0–46.0)
HEMOGLOBIN: 13.8 g/dL (ref 12.0–15.0)
LYMPHS PCT: 26.7 % (ref 12.0–46.0)
Lymphs Abs: 1.6 10*3/uL (ref 0.7–4.0)
MCHC: 33.6 g/dL (ref 30.0–36.0)
MCV: 93.9 fl (ref 78.0–100.0)
MONO ABS: 0.6 10*3/uL (ref 0.1–1.0)
Monocytes Relative: 9.6 % (ref 3.0–12.0)
Neutro Abs: 3.7 10*3/uL (ref 1.4–7.7)
Neutrophils Relative %: 59.9 % (ref 43.0–77.0)
Platelets: 267 10*3/uL (ref 150.0–400.0)
RBC: 4.38 Mil/uL (ref 3.87–5.11)
RDW: 15.7 % — ABNORMAL HIGH (ref 11.5–15.5)
WBC: 6.2 10*3/uL (ref 4.0–10.5)

## 2017-03-22 LAB — VITAMIN D 25 HYDROXY (VIT D DEFICIENCY, FRACTURES): VITD: 49.03 ng/mL (ref 30.00–100.00)

## 2017-03-22 LAB — LIPID PANEL
CHOLESTEROL: 236 mg/dL — AB (ref 0–200)
HDL: 85.8 mg/dL (ref >39.00)
LDL CALC: 115 mg/dL — AB (ref 0–99)
NonHDL: 149.98
TRIGLYCERIDES: 176 mg/dL — AB (ref 0.0–149.0)
Total CHOL/HDL Ratio: 3
VLDL: 35.2 mg/dL (ref 0.0–40.0)

## 2017-03-22 LAB — TSH: TSH: 2.1 u[IU]/mL (ref 0.35–4.50)

## 2017-03-22 LAB — HEMOGLOBIN A1C: HEMOGLOBIN A1C: 6.1 % (ref 4.6–6.5)

## 2017-03-25 ENCOUNTER — Encounter: Payer: Self-pay | Admitting: Internal Medicine

## 2017-03-25 ENCOUNTER — Other Ambulatory Visit: Payer: Self-pay | Admitting: Internal Medicine

## 2017-03-25 DIAGNOSIS — R7303 Prediabetes: Secondary | ICD-10-CM | POA: Insufficient documentation

## 2017-03-30 ENCOUNTER — Encounter: Payer: Self-pay | Admitting: Emergency Medicine

## 2017-04-04 ENCOUNTER — Other Ambulatory Visit: Payer: Self-pay | Admitting: Internal Medicine

## 2017-04-04 ENCOUNTER — Telehealth: Payer: Self-pay | Admitting: Internal Medicine

## 2017-04-04 NOTE — Telephone Encounter (Signed)
Spoke with pt to inform. Pt understood.  

## 2017-04-04 NOTE — Telephone Encounter (Signed)
Pt called back for lab results

## 2017-07-13 ENCOUNTER — Other Ambulatory Visit: Payer: Self-pay | Admitting: Obstetrics and Gynecology

## 2017-07-13 DIAGNOSIS — Z1231 Encounter for screening mammogram for malignant neoplasm of breast: Secondary | ICD-10-CM

## 2017-08-14 ENCOUNTER — Ambulatory Visit
Admission: RE | Admit: 2017-08-14 | Discharge: 2017-08-14 | Disposition: A | Discharge status: Home / Self Care | Payer: 59 | Source: Ambulatory Visit | Attending: Obstetrics and Gynecology | Admitting: Obstetrics and Gynecology

## 2017-08-14 DIAGNOSIS — Z1231 Encounter for screening mammogram for malignant neoplasm of breast: Secondary | ICD-10-CM

## 2017-08-15 ENCOUNTER — Other Ambulatory Visit: Payer: Self-pay | Admitting: Obstetrics and Gynecology

## 2017-08-15 DIAGNOSIS — R928 Other abnormal and inconclusive findings on diagnostic imaging of breast: Secondary | ICD-10-CM

## 2017-08-18 ENCOUNTER — Ambulatory Visit
Admission: RE | Admit: 2017-08-18 | Discharge: 2017-08-18 | Disposition: A | Discharge status: Home / Self Care | Payer: 59 | Source: Ambulatory Visit | Attending: Obstetrics and Gynecology | Admitting: Obstetrics and Gynecology

## 2017-08-18 ENCOUNTER — Other Ambulatory Visit: Payer: Self-pay | Admitting: Obstetrics and Gynecology

## 2017-08-18 DIAGNOSIS — R928 Other abnormal and inconclusive findings on diagnostic imaging of breast: Secondary | ICD-10-CM

## 2017-10-04 ENCOUNTER — Other Ambulatory Visit: Payer: Self-pay | Admitting: Internal Medicine

## 2017-10-05 ENCOUNTER — Other Ambulatory Visit: Payer: Self-pay | Admitting: Internal Medicine

## 2018-01-22 ENCOUNTER — Ambulatory Visit: Payer: 59 | Admitting: Internal Medicine

## 2018-01-22 ENCOUNTER — Encounter: Payer: Self-pay | Admitting: Internal Medicine

## 2018-01-22 ENCOUNTER — Other Ambulatory Visit: Payer: 59

## 2018-01-22 VITALS — BP 118/82 | HR 67 | Temp 98.8°F | Resp 16 | Wt 167.0 lb

## 2018-01-22 DIAGNOSIS — R300 Dysuria: Secondary | ICD-10-CM | POA: Diagnosis not present

## 2018-01-22 DIAGNOSIS — B379 Candidiasis, unspecified: Secondary | ICD-10-CM

## 2018-01-22 DIAGNOSIS — N3001 Acute cystitis with hematuria: Secondary | ICD-10-CM | POA: Insufficient documentation

## 2018-01-22 DIAGNOSIS — R102 Pelvic and perineal pain: Secondary | ICD-10-CM | POA: Diagnosis not present

## 2018-01-22 LAB — POCT URINALYSIS DIPSTICK
Bilirubin, UA: NEGATIVE
Glucose, UA: NEGATIVE
KETONES UA: NEGATIVE
NITRITE UA: NEGATIVE
PH UA: 6 (ref 5.0–8.0)
PROTEIN UA: NEGATIVE
SPEC GRAV UA: 1.01 (ref 1.010–1.025)
Urobilinogen, UA: 0.2 E.U./dL

## 2018-01-22 MED ORDER — NITROFURANTOIN MONOHYD MACRO 100 MG PO CAPS: 100.0000 mg | ORAL_CAPSULE | Freq: Two times a day (BID) | ORAL | 0 refills | Status: DC

## 2018-01-22 MED ORDER — FLUCONAZOLE 150 MG PO TABS: 150.0000 mg | ORAL_TABLET | Freq: Once | ORAL | 0 refills | Status: AC

## 2018-01-22 NOTE — Assessment & Plan Note (Signed)
She may possibly have a yeast infection on top of her UTI Diflucan x 1 now and then after finishing the antibiotic

## 2018-01-22 NOTE — Assessment & Plan Note (Signed)
She is having some vaginal pain - may be related to infection - uti and yeast infection, but concern of possible trauma from sexual intercourse or other vaginal infection Will schedule appt with Gyn

## 2018-01-22 NOTE — Assessment & Plan Note (Signed)
Urine dip consistent with UTI Will send urine for culture Take the antibiotic as prescribed.   Take tylenol if needed.   Increase your water intake.  Call if no improvement   

## 2018-01-22 NOTE — Patient Instructions (Addendum)
Urine is consistent with a UTI Will send urine for culture Take the antibiotic as prescribed.   Take tylenol if needed.   Increase your water intake.   You may also have a yeast infection - take te medication once now and after completing the antibiotic.  Follow up with Dr Sandra [name].  Call if no improvement      Urinary Tract Infection, Adult A urinary tract infection (UTI) is an infection of any part of the urinary tract, which includes the kidneys, ureters, bladder, and urethra. These organs make, store, and get rid of urine in the body. UTI can be a bladder infection (cystitis) or kidney infection (pyelonephritis). What are the causes? This infection may be caused by fungi, viruses, or bacteria. Bacteria are the most common cause of UTIs. This condition can also be caused by repeated incomplete emptying of the bladder during urination. What increases the risk? This condition is more likely to develop if:  You ignore your need to urinate or hold urine for long periods of time.  You do not empty your bladder completely during urination.  You wipe back to front after urinating or having a bowel movement, if you are female.  You are uncircumcised, if you are female.  You are constipated.  You have a urinary catheter that stays in place (indwelling).  You have a weak defense (immune) system.  You have a medical condition that affects your bowels, kidneys, or bladder.  You have diabetes.  You take antibiotic medicines frequently or for long periods of time, and the antibiotics no longer work well against certain types of infections (antibiotic resistance).  You take medicines that irritate your urinary tract.  You are exposed to chemicals that irritate your urinary tract.  You are female.  What are the signs or symptoms? Symptoms of this condition include:  Fever.  Frequent urination or passing small amounts of urine frequently.  Needing to urinate urgently.  Pain or  burning with urination.  Urine that smells bad or unusual.  Cloudy urine.  Pain in the lower abdomen or back.  Trouble urinating.  Blood in the urine.  Vomiting or being less hungry than normal.  Diarrhea or abdominal pain.  Vaginal discharge, if you are female.  How is this diagnosed? This condition is diagnosed with a medical history and physical exam. You will also need to provide a urine sample to test your urine. Other tests may be done, including:  Blood tests.  Sexually transmitted disease (STD) testing.  If you have had more than one UTI, a cystoscopy or imaging studies may be done to determine the cause of the infections. How is this treated? Treatment for this condition often includes a combination of two or more of the following:  Antibiotic medicine.  Other medicines to treat less common causes of UTI.  Over-the-counter medicines to treat pain.  Drinking enough water to stay hydrated.  Follow these instructions at home:  Take over-the-counter and prescription medicines only as told by your health care provider.  If you were prescribed an antibiotic, take it as told by your health care provider. Do not stop taking the antibiotic even if you start to feel better.  Avoid alcohol, caffeine, tea, and carbonated beverages. They can irritate your bladder.  Drink enough fluid to keep your urine clear or pale yellow.  Keep all follow-up visits as told by your health care provider. This is important.  Make sure to: ? Empty your bladder often and completely. Do not  hold urine for long periods of time. ? Empty your bladder before and after sex. ? Wipe from front to back after a bowel movement if you are female. Use each tissue one time when you wipe. Contact a health care provider if:  You have back pain.  You have a fever.  You feel nauseous or vomit.  Your symptoms do not get better after 3 days.  Your symptoms go away and then return. Get help right  away if:  You have severe back pain or lower abdominal pain.  You are vomiting and cannot keep down any medicines or water. This information is not intended to replace advice given to you by your health care provider. Make sure you discuss any questions you have with your health care provider. Document Released: 08/17/2005 Document Revised: 04/20/2016 Document Reviewed: 09/28/2015 Elsevier Interactive Patient Education  [employer].
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
## 2018-01-23 LAB — URINE CULTURE
MICRO NUMBER:: 90274978
SPECIMEN QUALITY: ADEQUATE

## 2018-02-02 ENCOUNTER — Telehealth: Payer: Self-pay | Admitting: Emergency Medicine

## 2018-02-02 ENCOUNTER — Other Ambulatory Visit (INDEPENDENT_AMBULATORY_CARE_PROVIDER_SITE_OTHER): Payer: 59

## 2018-02-02 DIAGNOSIS — R39198 Other difficulties with micturition: Secondary | ICD-10-CM | POA: Diagnosis not present

## 2018-02-02 LAB — URINALYSIS, ROUTINE W REFLEX MICROSCOPIC
BILIRUBIN URINE: NEGATIVE
Hgb urine dipstick: NEGATIVE
KETONES UR: NEGATIVE
Nitrite: NEGATIVE
RBC / HPF: NONE SEEN (ref 0–?)
Specific Gravity, Urine: 1.01 (ref 1.000–1.030)
Total Protein, Urine: NEGATIVE
URINE GLUCOSE: NEGATIVE
UROBILINOGEN UA: 0.2 (ref 0.0–1.0)
pH: 7 (ref 5.0–8.0)

## 2018-02-02 NOTE — Telephone Encounter (Signed)
Patient coming over now to give urine sample

## 2018-02-02 NOTE — Telephone Encounter (Signed)
Let us recheck her urine-urinalysis, urine culture ordered

## 2018-02-02 NOTE — Telephone Encounter (Signed)
Copied from CRM [phone]. Topic: General - Other >> Feb 02, 2018  8:10 AM Gerrianne ScalePayne, Angela L wrote: Reason for CRM: patient states that she still have a little sensation when she go to the bathroom and that her bladder be feeling full but when she go to the bathroom she has to strain to go to the bathroom a just a little bit of urine come out and sometimes nothing comes out she just finish her medicine on Medicine on Monday for a UTI please give her a call back at work [phone]

## 2018-02-03 LAB — URINE CULTURE
MICRO NUMBER: 90331452
Result:: NO GROWTH
SPECIMEN QUALITY: ADEQUATE

## 2018-03-22 NOTE — Progress Notes (Signed)
Subjective:    Patient ID: Sandra [name], female    DOB: 1951-11-06, 67 y.o.   MRN: [id number]  HPI She is here for a physical exam.   She knows she needs to be more active, but she is exercising.  She is still drinking too much beer - she typically drinks 4-6 beers a night.  She knows she needs to cut down.  She denies depression or anxiety.  She thinks it is just habit.  Medications and allergies reviewed with patient and updated if appropriate.  Patient Active Problem List   Diagnosis Date Noted  . Genital herpes 03/23/2018  . Prediabetes 03/25/2017  . Knee swelling 06/16/2016  . Obesity (BMI 30-39.9) 05/02/2014  . Edema 09/16/2013  . Vitamin D deficiency 03/01/2013  . Osteopenia 03/30/2010  . Hyperlipidemia 03/17/2008    Current Outpatient Medications on File Prior to Visit  Medication Sig Dispense Refill  . Calcium Carbonate-Vitamin D (CALCIUM 600+D) 600-400 MG-UNIT tablet Take 1 tablet by mouth 2 (two) times daily.    Marland Kitchen ezetimibe (ZETIA) 10 MG tablet Take 10 mg by mouth daily.    . hydrochlorothiazide (MICROZIDE) 12.5 MG capsule TAKE 1 CAPSULE (12.5 MG TOTAL) BY MOUTH DAILY. 90 capsule 1  . rosuvastatin (CRESTOR) 20 MG tablet Take 20 mg by mouth daily.    . valACYclovir (VALTREX) 1000 MG tablet Take 1,000 mg by mouth every 12 (twelve) hours.  0   No current facility-administered medications on file prior to visit.     Past Medical History:  Diagnosis Date  . Hyperlipidemia   . Osteopenia    ??    Past Surgical History:  Procedure Laterality Date  . CATARACT EXTRACTION Bilateral   . COLONOSCOPY     Dr Jeralene Peters  . DILATION AND CURETTAGE OF UTERUS    . G0P0    . WISDOM TOOTH EXTRACTION      Social History   Socioeconomic History  . Marital status: Divorced    Spouse name: Not on file  . Number of children: Not on file  . Years of education: Not on file  . Highest education level: Not on file  Occupational History  . Occupation: Retail buyer &  part-time dietary aide    Employer: UNEMPLOYED  Social Needs  . Financial resource strain: Not on file  . Food insecurity:    Worry: Not on file    Inability: Not on file  . Transportation needs:    Medical: Not on file    Non-medical: Not on file  Tobacco Use  . Smoking status: Never Smoker  . Smokeless tobacco: Never Used  Substance and Sexual Activity  . Alcohol use: Yes    Comment: 4-6 beers a day  . Drug use: No  . Sexual activity: Not on file  Lifestyle  . Physical activity:    Days per week: Not on file    Minutes per session: Not on file  . Stress: Not on file  Relationships  . Social connections:    Talks on phone: Not on file    Gets together: Not on file    Attends religious service: Not on file    Active member of club or organization: Not on file    Attends meetings of clubs or organizations: Not on file    Relationship status: Not on file  Other Topics Concern  . Not on file  Social History Narrative   No regular exercise    Family History  Problem Relation  Age of Onset  . Stroke Father        Alsheimer's  . Diabetes Other        mat. & pat sides  . Hypertension Other        mat & pat sides  . Heart disease Mother        irreg heart rate & heart failure  . Hyperlipidemia Sister   . Glaucoma Sister   . Hyperlipidemia Brother   . Cancer Paternal Aunt        X 2 had breast cncer; 1 aunt cervical cancer  . Heart attack Maternal Grandmother        < 65  . Glaucoma Maternal Aunt   . Macular degeneration Maternal Aunt   . Hypertension Maternal Aunt   . Asthma Maternal Aunt     Review of Systems  Constitutional: Negative for chills and fever.  Eyes: Negative for visual disturbance.  Respiratory: Negative for cough, shortness of breath and wheezing.   Cardiovascular: Positive for palpitations (occ) and leg swelling (mild). Negative for chest pain.  Gastrointestinal: Negative for abdominal pain, blood in stool, constipation, diarrhea and nausea.        No gerd  Genitourinary: Negative for dysuria, hematuria and vaginal pain.  Musculoskeletal: Negative for arthralgias and back pain.  Skin: Negative for color change and rash.  Neurological: Negative for light-headedness and headaches.  Psychiatric/Behavioral: Negative for dysphoric mood. The patient is not nervous/anxious.        Objective:   Vitals:   03/23/18 1514  BP: 114/70  Pulse: 88  Resp: 16  Temp: 98.6 F (37 C)  SpO2: 98%   Filed Weights   03/23/18 1514  Weight: 167 lb (75.8 kg)   Body mass index is 31.55 kg/m.  Wt Readings from Last 3 Encounters:  03/23/18 167 lb (75.8 kg)  01/22/18 167 lb (75.8 kg)  03/21/17 174 lb (78.9 kg)     Physical Exam Constitutional: She appears well-developed and well-nourished. No distress.  HENT:  Head: Normocephalic and atraumatic.  Right Ear: External ear normal. Normal ear canal and TM Left Ear: External ear normal.  Normal ear canal and TM Mouth/Throat: Oropharynx is clear and moist.  Eyes: Conjunctivae and EOM are normal.  Neck: Neck supple. No tracheal deviation present. No thyromegaly present.  No carotid bruit  Cardiovascular: Normal rate, regular rhythm and normal heart sounds.   No murmur heard.  Mild b/l ankle edema. Pulmonary/Chest: Effort normal and breath sounds normal. No respiratory distress. She has no wheezes. She has no rales.  Breast: deferred to Gyn Abdominal: Soft. She exhibits no distension. There is no tenderness.  Lymphadenopathy: She has no cervical adenopathy.  Skin: Skin is warm and dry. She is not diaphoretic.  Psychiatric: She has a normal mood and affect. Her behavior is normal.        Assessment & Plan:   Physical exam: Screening blood work  ordered Immunizations  Discussed shingrix, td discussed Colonoscopy    Up to date  Mammogram   Up to date  Gyn    Up to date  Dexa  Up to date - done  At gyn office Eye exams  Up to date  EKG    Done 05/2016 Exercise  walking - could do more  - encouraged increasing her exercise Weight   Advised weight loss Skin  No concerns Substance abuse  Drinking 4-6 beers a night - she knows it is too much.  It is more habit  -  discussed need to cut done  See Problem List for Assessment and Plan of chronic medical problems.

## 2018-03-22 NOTE — Patient Instructions (Addendum)
Test(s) ordered today. Your results will be released to MyChart (or called to you) after review, usually within 72hours after test completion. If any changes need to be made, you will be notified at that same time.  All other Health Maintenance issues reviewed.   All recommended immunizations and age-appropriate screenings are up-to-date or discussed.  No immunizations administered today.   Medications reviewed and updated.  No changes recommended at this time.   Please followup in 6 months   Health Maintenance, Female Adopting a healthy lifestyle and getting preventive care can go a long way to promote health and wellness. Talk with your health care provider about what schedule of regular examinations is right for you. This is a good chance for you to check in with your provider about disease prevention and staying healthy. In between checkups, there are plenty of things you can do on your own. Experts have done a lot of research about which lifestyle changes and preventive measures are most likely to keep you healthy. Ask your health care provider for more information. Weight and diet Eat a healthy diet  Be sure to include plenty of vegetables, fruits, low-fat dairy products, and lean protein.  Do not eat a lot of foods high in solid fats, added sugars, or salt.  Get regular exercise. This is one of the most important things you can do for your health. ? Most adults should exercise for at least 150 minutes each week. The exercise should increase your heart rate and make you sweat (moderate-intensity exercise). ? Most adults should also do strengthening exercises at least twice a week. This is in addition to the moderate-intensity exercise.  Maintain a healthy weight  Body mass index (BMI) is a measurement that can be used to identify possible weight problems. It estimates body fat based on height and weight. Your health care provider can help determine your BMI and help you achieve or  maintain a healthy weight.  For females 20 years of age and older: ? A BMI below 18.5 is considered underweight. ? A BMI of 18.5 to 24.9 is normal. ? A BMI of 25 to 29.9 is considered overweight. ? A BMI of 30 and above is considered obese.  Watch levels of cholesterol and blood lipids  You should start having your blood tested for lipids and cholesterol at 65 years of age, then have this test every 5 years.  You may need to have your cholesterol levels checked more often if: ? Your lipid or cholesterol levels are high. ? You are older than 65 years of age. ? You are at high risk for heart disease.  Cancer screening Lung Cancer  Lung cancer screening is recommended for adults 55-80 years old who are at high risk for lung cancer because of a history of smoking.  A yearly low-dose CT scan of the lungs is recommended for people who: ? Currently smoke. ? Have quit within the past 15 years. ? Have at least a 30-pack-year history of smoking. A pack year is smoking an average of one pack of cigarettes a day for 1 year.  Yearly screening should continue until it has been 15 years since you quit.  Yearly screening should stop if you develop a health problem that would prevent you from having lung cancer treatment.  Breast Cancer  Practice breast self-awareness. This means understanding how your breasts normally appear and feel.  It also means doing regular breast self-exams. Let your health care provider know about any changes,   changes, no matter how small.  If you are in your 20s or 30s, you should have a clinical breast exam (CBE) by a health care provider every 1-3 years as part of a regular health exam.  If you are 40 or older, have a CBE every year. Also consider having a breast X-ray (mammogram) every year.  If you have a family history of breast cancer, talk to your health care provider about genetic screening.  If you are at high risk for breast cancer, talk to your health care  provider about having an MRI and a mammogram every year.  Breast cancer gene (BRCA) assessment is recommended for women who have family members with BRCA-related cancers. BRCA-related cancers include: ? Breast. ? Ovarian. ? Tubal. ? Peritoneal cancers.  Results of the assessment will determine the need for genetic counseling and BRCA1 and BRCA2 testing.  Cervical Cancer Your health care provider may recommend that you be screened regularly for cancer of the pelvic organs (ovaries, uterus, and vagina). This screening involves a pelvic examination, including checking for microscopic changes to the surface of your cervix (Pap test). You may be encouraged to have this screening done every 3 years, beginning at age 21.  For women ages 30-65, health care providers may recommend pelvic exams and Pap testing every 3 years, or they may recommend the Pap and pelvic exam, combined with testing for human papilloma virus (HPV), every 5 years. Some types of HPV increase your risk of cervical cancer. Testing for HPV may also be done on women of any age with unclear Pap test results.  Other health care providers may not recommend any screening for nonpregnant women who are considered low risk for pelvic cancer and who do not have symptoms. Ask your health care provider if a screening pelvic exam is right for you.  If you have had past treatment for cervical cancer or a condition that could lead to cancer, you need Pap tests and screening for cancer for at least 20 years after your treatment. If Pap tests have been discontinued, your risk factors (such as having a new sexual partner) need to be reassessed to determine if screening should resume. Some women have medical problems that increase the chance of getting cervical cancer. In these cases, your health care provider may recommend more frequent screening and Pap tests.  Colorectal Cancer  This type of cancer can be detected and often prevented.  Routine  colorectal cancer screening usually begins at 65 years of age and continues through 65 years of age.  Your health care provider may recommend screening at an earlier age if you have risk factors for colon cancer.  Your health care provider may also recommend using home test kits to check for hidden blood in the stool.  A small camera at the end of a tube can be used to examine your colon directly (sigmoidoscopy or colonoscopy). This is done to check for the earliest forms of colorectal cancer.  Routine screening usually begins at age 50.  Direct examination of the colon should be repeated every 5-10 years through 65 years of age. However, you may need to be screened more often if early forms of precancerous polyps or small growths are found.  Skin Cancer  Check your skin from head to toe regularly.  Tell your health care provider about any new moles or changes in moles, especially if there is a change in a mole's shape or color.  Also tell your health care provider if   you have a mole that is larger than the size of a pencil eraser.  Always use sunscreen. Apply sunscreen liberally and repeatedly throughout the day.  Protect yourself by wearing long sleeves, pants, a wide-brimmed hat, and sunglasses whenever you are outside.  Heart disease, diabetes, and high blood pressure  High blood pressure causes heart disease and increases the risk of stroke. High blood pressure is more likely to develop in: ? People who have blood pressure in the high end of the normal range (130-139/85-89 mm Hg). ? People who are overweight or obese. ? People who are African American.  If you are 55-1 years of age, have your blood pressure checked every 3-5 years. If you are 86 years of age or older, have your blood pressure checked every year. You should have your blood pressure measured twice-once when you are at a hospital or clinic, and once when you are not at a hospital or clinic. Record the average of the  two measurements. To check your blood pressure when you are not at a hospital or clinic, you can use: ? An automated blood pressure machine at a pharmacy. ? A home blood pressure monitor.  If you are between 15 years and 81 years old, ask your health care provider if you should take aspirin to prevent strokes.  Have regular diabetes screenings. This involves taking a blood sample to check your fasting blood sugar level. ? If you are at a normal weight and have a low risk for diabetes, have this test once every three years after 66 years of age. ? If you are overweight and have a high risk for diabetes, consider being tested at a younger age or more often. Preventing infection Hepatitis B  If you have a higher risk for hepatitis B, you should be screened for this virus. You are considered at high risk for hepatitis B if: ? You were born in a country where hepatitis B is common. Ask your health care provider which countries are considered high risk. ? Your parents were born in a high-risk country, and you have not been immunized against hepatitis B (hepatitis B vaccine). ? You have HIV or AIDS. ? You use needles to inject street drugs. ? You live with someone who has hepatitis B. ? You have had sex with someone who has hepatitis B. ? You get hemodialysis treatment. ? You take certain medicines for conditions, including cancer, organ transplantation, and autoimmune conditions.  Hepatitis C  Blood testing is recommended for: ? Everyone born from [birth year] through 1963. ? Anyone with known risk factors for hepatitis C.  Sexually transmitted infections (STIs)  You should be screened for sexually transmitted infections (STIs) including gonorrhea and chlamydia if: ? You are sexually active and are younger than 65 years of age. ? You are older than 65 years of age and your health care provider tells you that you are at risk for this type of infection. ? Your sexual activity has changed since you  were last screened and you are at an increased risk for chlamydia or gonorrhea. Ask your health care provider if you are at risk.  If you do not have HIV, but are at risk, it may be recommended that you take a prescription medicine daily to prevent HIV infection. This is called pre-exposure prophylaxis (PrEP). You are considered at risk if: ? You are sexually active and do not regularly use condoms or know the HIV status of your partner(s). ? You take drugs by  injection. ? You are sexually active with a partner who has HIV.  Talk with your health care provider about whether you are at high risk of being infected with HIV. If you choose to begin PrEP, you should first be tested for HIV. You should then be tested every 3 months for as long as you are taking PrEP. Pregnancy  If you are premenopausal and you may become pregnant, ask your health care provider about preconception counseling.  If you may become pregnant, take 400 to 800 micrograms (mcg) of folic acid every day.  If you want to prevent pregnancy, talk to your health care provider about birth control (contraception). Osteoporosis and menopause  Osteoporosis is a disease in which the bones lose minerals and strength with aging. This can result in serious bone fractures. Your risk for osteoporosis can be identified using a bone density scan.  If you are 69 years of age or older, or if you are at risk for osteoporosis and fractures, ask your health care provider if you should be screened.  Ask your health care provider whether you should take a calcium or vitamin D supplement to lower your risk for osteoporosis.  Menopause may have certain physical symptoms and risks.  Hormone replacement therapy may reduce some of these symptoms and risks. Talk to your health care provider about whether hormone replacement therapy is right for you. Follow these instructions at home:  Schedule regular health, dental, and eye exams.  Stay current  with your immunizations.  Do not use any tobacco products including cigarettes, chewing tobacco, or electronic cigarettes.  If you are pregnant, do not drink alcohol.  If you are breastfeeding, limit how much and how often you drink alcohol.  Limit alcohol intake to no more than 1 drink per day for nonpregnant women. One drink equals 12 ounces of beer, 5 ounces of wine, or 1 ounces of hard liquor.  Do not use street drugs.  Do not share needles.  Ask your health care provider for help if you need support or information about quitting drugs.  Tell your health care provider if you often feel depressed.  Tell your health care provider if you have ever been abused or do not feel safe at home. This information is not intended to replace advice given to you by your health care provider. Make sure you discuss any questions you have with your health care provider. Document Released: 05/23/2011 Document Revised: 04/14/2016 Document Reviewed: 08/11/2015 Elsevier Interactive Patient Education  [employer].

## 2018-03-23 ENCOUNTER — Encounter: Payer: Self-pay | Admitting: Internal Medicine

## 2018-03-23 ENCOUNTER — Ambulatory Visit (INDEPENDENT_AMBULATORY_CARE_PROVIDER_SITE_OTHER): Payer: 59 | Admitting: Internal Medicine

## 2018-03-23 VITALS — BP 114/70 | HR 88 | Temp 98.6°F | Resp 16 | Wt 167.0 lb

## 2018-03-23 DIAGNOSIS — Z0001 Encounter for general adult medical examination with abnormal findings: Secondary | ICD-10-CM | POA: Diagnosis not present

## 2018-03-23 DIAGNOSIS — R7303 Prediabetes: Secondary | ICD-10-CM

## 2018-03-23 DIAGNOSIS — E782 Mixed hyperlipidemia: Secondary | ICD-10-CM | POA: Diagnosis not present

## 2018-03-23 DIAGNOSIS — M8580 Other specified disorders of bone density and structure, unspecified site: Secondary | ICD-10-CM

## 2018-03-23 DIAGNOSIS — A6000 Herpesviral infection of urogenital system, unspecified: Secondary | ICD-10-CM | POA: Insufficient documentation

## 2018-03-23 DIAGNOSIS — R600 Localized edema: Secondary | ICD-10-CM | POA: Diagnosis not present

## 2018-03-23 NOTE — Assessment & Plan Note (Signed)
Check a1c Low sugar / carb diet Stressed regular exercise, weight loss  

## 2018-03-23 NOTE — Assessment & Plan Note (Addendum)
Check lipid panel, tsh, cmp  Continue daily statin Regular exercise and healthy diet encouraged  

## 2018-03-23 NOTE — Assessment & Plan Note (Signed)
Managed by Dr Arelia Sneddon - done at his office She will check with him

## 2018-03-24 ENCOUNTER — Encounter: Payer: Self-pay | Admitting: Internal Medicine

## 2018-03-24 NOTE — Assessment & Plan Note (Signed)
Controlled Continue hctz 12. 5 mg daily cmp

## 2018-03-28 ENCOUNTER — Telehealth: Payer: Self-pay | Admitting: Internal Medicine

## 2018-03-28 NOTE — Telephone Encounter (Signed)
Faxed request to [facility] [phone]  for medical records to send to Dr Lawerance Bach at [facility] 03/28/18 LM

## 2018-04-07 ENCOUNTER — Other Ambulatory Visit: Payer: Self-pay | Admitting: Internal Medicine

## 2018-04-09 ENCOUNTER — Other Ambulatory Visit (INDEPENDENT_AMBULATORY_CARE_PROVIDER_SITE_OTHER): Payer: 59

## 2018-04-09 DIAGNOSIS — R7303 Prediabetes: Secondary | ICD-10-CM | POA: Diagnosis not present

## 2018-04-09 DIAGNOSIS — R600 Localized edema: Secondary | ICD-10-CM

## 2018-04-09 DIAGNOSIS — E782 Mixed hyperlipidemia: Secondary | ICD-10-CM

## 2018-04-09 LAB — CBC WITH DIFFERENTIAL/PLATELET
BASOS PCT: 1 % (ref 0.0–3.0)
Basophils Absolute: 0.1 10*3/uL (ref 0.0–0.1)
Eosinophils Absolute: 0.2 10*3/uL (ref 0.0–0.7)
Eosinophils Relative: 2.5 % (ref 0.0–5.0)
HCT: 38.9 % (ref 36.0–46.0)
Hemoglobin: 13 g/dL (ref 12.0–15.0)
Lymphocytes Relative: 16.2 % (ref 12.0–46.0)
Lymphs Abs: 1.5 10*3/uL (ref 0.7–4.0)
MCHC: 33.4 g/dL (ref 30.0–36.0)
MCV: 95.3 fl (ref 78.0–100.0)
MONO ABS: 0.8 10*3/uL (ref 0.1–1.0)
Monocytes Relative: 8.8 % (ref 3.0–12.0)
NEUTROS ABS: 6.5 10*3/uL (ref 1.4–7.7)
NEUTROS PCT: 71.5 % (ref 43.0–77.0)
PLATELETS: 242 10*3/uL (ref 150.0–400.0)
RBC: 4.08 Mil/uL (ref 3.87–5.11)
RDW: 16 % — AB (ref 11.5–15.5)
WBC: 9 10*3/uL (ref 4.0–10.5)

## 2018-04-09 LAB — COMPREHENSIVE METABOLIC PANEL
ALBUMIN: 4 g/dL (ref 3.5–5.2)
ALT: 14 U/L (ref 0–35)
AST: 18 U/L (ref 0–37)
Alkaline Phosphatase: 56 U/L (ref 39–117)
BUN: 13 mg/dL (ref 6–23)
CHLORIDE: 107 meq/L (ref 96–112)
CO2: 25 meq/L (ref 19–32)
CREATININE: 0.88 mg/dL (ref 0.40–1.20)
Calcium: 9.2 mg/dL (ref 8.4–10.5)
GFR: 83.02 mL/min (ref >60.00)
Glucose, Bld: 74 mg/dL (ref 70–99)
Potassium: 4 mEq/L (ref 3.5–5.1)
SODIUM: 143 meq/L (ref 135–145)
Total Bilirubin: 0.5 mg/dL (ref 0.2–1.2)
Total Protein: 7.6 g/dL (ref 6.0–8.3)

## 2018-04-09 LAB — LIPID PANEL
CHOLESTEROL: 233 mg/dL — AB (ref 0–200)
HDL: 89.3 mg/dL (ref >39.00)
LDL Cholesterol: 122 mg/dL — ABNORMAL HIGH (ref 0–99)
NonHDL: 143.57
Total CHOL/HDL Ratio: 3
Triglycerides: 107 mg/dL (ref 0.0–149.0)
VLDL: 21.4 mg/dL (ref 0.0–40.0)

## 2018-04-09 LAB — HEMOGLOBIN A1C: Hgb A1c MFr Bld: 5.9 % (ref 4.6–6.5)

## 2018-04-09 LAB — TSH: TSH: 1.63 u[IU]/mL (ref 0.35–4.50)

## 2018-04-09 NOTE — Telephone Encounter (Signed)
Is patient supposed to be on both Zetia and Crestor?

## 2018-04-09 NOTE — Telephone Encounter (Signed)
Yes - ok - been on both for years.   rx's sent

## 2018-04-12 NOTE — Telephone Encounter (Signed)
Rec'd records from [facility]. 2 documents scanned into Epic & Routed to Cheryll Cockayne MD 04/12/18 LM

## 2018-09-04 ENCOUNTER — Other Ambulatory Visit: Payer: Self-pay | Admitting: Obstetrics and Gynecology

## 2018-09-04 DIAGNOSIS — Z1231 Encounter for screening mammogram for malignant neoplasm of breast: Secondary | ICD-10-CM

## 2018-09-11 ENCOUNTER — Ambulatory Visit
Admission: RE | Admit: 2018-09-11 | Discharge: 2018-09-11 | Disposition: A | Discharge status: Home / Self Care | Payer: 59 | Source: Ambulatory Visit | Attending: Obstetrics and Gynecology | Admitting: Obstetrics and Gynecology

## 2018-09-11 DIAGNOSIS — Z1231 Encounter for screening mammogram for malignant neoplasm of breast: Secondary | ICD-10-CM

## 2018-09-24 NOTE — Progress Notes (Signed)
Subjective:    Patient ID: Sandra [name], female    DOB: 1951-03-13, 67 y.o.   MRN: [id number]  HPI The patient is here for follow up.  Prediabetes:  She is compliant with a low sugar/carbohydrate diet.  She is not exercising regularly.  Hyperlipidemia: She is taking her medication daily. She is compliant with a low fat/cholesterol diet. She is not exercising regularly. She denies myalgias.   Leg edema:  She takes hctz 12.5 mg daily.  Her swelling in her legs is well controlled.   Obesity:  She did lost weight but regained some. She had changed her diet and it worked.  She is still drinking too much and trying to decrease her alcohol.  She is not exercising.    Medications and allergies reviewed with patient and updated if appropriate.  Patient Active Problem List   Diagnosis Date Noted  . Genital herpes 03/23/2018  . Prediabetes 03/25/2017  . Knee swelling 06/16/2016  . Obesity (BMI 30-39.9) 05/02/2014  . Edema 09/16/2013  . Vitamin D deficiency 03/01/2013  . Osteopenia 03/30/2010  . Hyperlipidemia 03/17/2008    Current Outpatient Medications on File Prior to Visit  Medication Sig Dispense Refill  . Calcium Carbonate-Vitamin D (CALCIUM 600+D) 600-400 MG-UNIT tablet Take 1 tablet by mouth 2 (two) times daily.    Marland Kitchen ezetimibe (ZETIA) 10 MG tablet TAKE 1 TABLET BY MOUTH EVERY DAY 90 tablet 1  . hydrochlorothiazide (MICROZIDE) 12.5 MG capsule TAKE 1 CAPSULE (12.5 MG TOTAL) BY MOUTH DAILY. 90 capsule 1  . rosuvastatin (CRESTOR) 20 MG tablet TAKE 1 TABLET (20 MG TOTAL) BY MOUTH DAILY. 90 tablet 1   No current facility-administered medications on file prior to visit.     Past Medical History:  Diagnosis Date  . Hyperlipidemia   . Osteopenia    ??    Past Surgical History:  Procedure Laterality Date  . CATARACT EXTRACTION Bilateral   . COLONOSCOPY     Dr Jeralene Peters  . DILATION AND CURETTAGE OF UTERUS    . G0P0    . WISDOM TOOTH EXTRACTION      Social History    Socioeconomic History  . Marital status: Divorced    Spouse name: Not on file  . Number of children: Not on file  . Years of education: Not on file  . Highest education level: Not on file  Occupational History  . Occupation: Retail buyer & part-time dietary aide    Employer: UNEMPLOYED  Social Needs  . Financial resource strain: Not on file  . Food insecurity:    Worry: Not on file    Inability: Not on file  . Transportation needs:    Medical: Not on file    Non-medical: Not on file  Tobacco Use  . Smoking status: Never Smoker  . Smokeless tobacco: Never Used  Substance and Sexual Activity  . Alcohol use: Yes    Comment: 4-6 beers a day  . Drug use: No  . Sexual activity: Not on file  Lifestyle  . Physical activity:    Days per week: Not on file    Minutes per session: Not on file  . Stress: Not on file  Relationships  . Social connections:    Talks on phone: Not on file    Gets together: Not on file    Attends religious service: Not on file    Active member of club or organization: Not on file    Attends meetings of clubs or organizations:  Not on file    Relationship status: Not on file  Other Topics Concern  . Not on file  Social History Narrative   No regular exercise    Family History  Problem Relation Age of Onset  . Stroke Father        Alsheimer's  . Diabetes Other        mat. & pat sides  . Hypertension Other        mat & pat sides  . Heart disease Mother        irreg heart rate & heart failure  . Hyperlipidemia Sister   . Glaucoma Sister   . Hyperlipidemia Brother   . Cancer Paternal Aunt        X 2 had breast cncer; 1 aunt cervical cancer  . Heart attack Maternal Grandmother        < 65  . Glaucoma Maternal Aunt   . Macular degeneration Maternal Aunt   . Hypertension Maternal Aunt   . Asthma Maternal Aunt     Review of Systems  Constitutional: Negative for chills and fever.  Respiratory: Negative for cough, shortness of breath  and wheezing.   Cardiovascular: Positive for palpitations and leg swelling (controlled). Negative for chest pain.  Gastrointestinal: Negative for abdominal pain and nausea.  Musculoskeletal: Negative for myalgias.  Neurological: Negative for light-headedness.       Objective:   Vitals:   09/25/18 1314  BP: 124/78  Pulse: 89  Resp: 16  Temp: 99.1 F (37.3 C)  SpO2: 98%   BP Readings from Last 3 Encounters:  09/25/18 124/78  03/23/18 114/70  01/22/18 118/82   Wt Readings from Last 3 Encounters:  09/25/18 177 lb 12.8 oz (80.6 kg)  03/23/18 167 lb (75.8 kg)  01/22/18 167 lb (75.8 kg)   Body mass index is 33.6 kg/m.   Physical Exam    Constitutional: Appears well-developed and well-nourished. No distress.  HENT:  Head: Normocephalic and atraumatic.  Neck: Neck supple. No tracheal deviation present. No thyromegaly present.  No cervical lymphadenopathy Cardiovascular: Normal rate, regular rhythm and normal heart sounds.   No murmur heard. No carotid bruit .  No edema Pulmonary/Chest: Effort normal and breath sounds normal. No respiratory distress. No has no wheezes. No rales.  Skin: Skin is warm and dry. Not diaphoretic.  Psychiatric: Normal mood and affect. Behavior is normal.      Assessment & Plan:    See Problem List for Assessment and Plan of chronic medical problems.

## 2018-09-24 NOTE — Patient Instructions (Addendum)
  Tests ordered today. Your results will be released to MyChart (or called to you) after review, usually within 72hours after test completion. If any changes need to be made, you will be notified at that same time.  prevnar pneumonia immunization administered today.    Medications reviewed and updated.  Changes include :   none   Please followup in 6 months

## 2018-09-25 ENCOUNTER — Other Ambulatory Visit (INDEPENDENT_AMBULATORY_CARE_PROVIDER_SITE_OTHER): Payer: 59

## 2018-09-25 ENCOUNTER — Encounter: Payer: Self-pay | Admitting: Internal Medicine

## 2018-09-25 ENCOUNTER — Ambulatory Visit: Payer: 59 | Admitting: Internal Medicine

## 2018-09-25 VITALS — BP 124/78 | HR 89 | Temp 99.1°F | Resp 16 | Ht 61.0 in | Wt 177.8 lb

## 2018-09-25 DIAGNOSIS — Z23 Encounter for immunization: Secondary | ICD-10-CM | POA: Diagnosis not present

## 2018-09-25 DIAGNOSIS — E669 Obesity, unspecified: Secondary | ICD-10-CM | POA: Diagnosis not present

## 2018-09-25 DIAGNOSIS — R600 Localized edema: Secondary | ICD-10-CM | POA: Diagnosis not present

## 2018-09-25 DIAGNOSIS — E782 Mixed hyperlipidemia: Secondary | ICD-10-CM | POA: Diagnosis not present

## 2018-09-25 DIAGNOSIS — M8580 Other specified disorders of bone density and structure, unspecified site: Secondary | ICD-10-CM

## 2018-09-25 DIAGNOSIS — R7303 Prediabetes: Secondary | ICD-10-CM

## 2018-09-25 LAB — COMPREHENSIVE METABOLIC PANEL
ALBUMIN: 4.2 g/dL (ref 3.5–5.2)
ALK PHOS: 69 U/L (ref 39–117)
ALT: 19 U/L (ref 0–35)
AST: 26 U/L (ref 0–37)
BUN: 14 mg/dL (ref 6–23)
CO2: 27 mEq/L (ref 19–32)
CREATININE: 0.97 mg/dL (ref 0.40–1.20)
Calcium: 9.8 mg/dL (ref 8.4–10.5)
Chloride: 104 mEq/L (ref 96–112)
GFR: 74.09 mL/min (ref >60.00)
Glucose, Bld: 89 mg/dL (ref 70–99)
Potassium: 3.8 mEq/L (ref 3.5–5.1)
SODIUM: 140 meq/L (ref 135–145)
TOTAL PROTEIN: 8 g/dL (ref 6.0–8.3)
Total Bilirubin: 0.4 mg/dL (ref 0.2–1.2)

## 2018-09-25 LAB — LIPID PANEL
CHOL/HDL RATIO: 3
CHOLESTEROL: 221 mg/dL — AB (ref 0–200)
HDL: 83 mg/dL (ref >39.00)
LDL CALC: 107 mg/dL — AB (ref 0–99)
NonHDL: 137.7
Triglycerides: 152 mg/dL — ABNORMAL HIGH (ref 0.0–149.0)
VLDL: 30.4 mg/dL (ref 0.0–40.0)

## 2018-09-25 LAB — HEMOGLOBIN A1C: Hgb A1c MFr Bld: 6.1 % (ref 4.6–6.5)

## 2018-09-25 MED ORDER — ROSUVASTATIN CALCIUM 20 MG PO TABS
ORAL_TABLET | ORAL | 1 refills | Status: DC
Start: 2018-09-25 — End: 2019-03-26

## 2018-09-25 MED ORDER — HYDROCHLOROTHIAZIDE 12.5 MG PO CAPS: 12.5000 mg | ORAL_CAPSULE | Freq: Every day | ORAL | 1 refills | Status: DC

## 2018-09-25 MED ORDER — EZETIMIBE 10 MG PO TABS
10.0000 mg | ORAL_TABLET | Freq: Every day | ORAL | 1 refills | Status: DC
Start: 2018-09-25 — End: 2019-03-26

## 2018-09-25 NOTE — Assessment & Plan Note (Signed)
Controlled hctz 12.5 mg daily - continue

## 2018-09-25 NOTE — Assessment & Plan Note (Signed)
Check a1c Low sugar / carb diet Stressed regular exercise, weight loss  

## 2018-09-25 NOTE — Assessment & Plan Note (Signed)
dexa monitored by GYn Stressed regular exercise Taking cal/vitamin d

## 2018-09-25 NOTE — Assessment & Plan Note (Signed)
Check lipid panel  Continue daily statin Regular exercise and healthy diet encouraged  

## 2018-09-25 NOTE — Assessment & Plan Note (Signed)
Will work on diet changes and exercising on a regular basis Decrease portions Fu in 6 months

## 2018-10-02 ENCOUNTER — Ambulatory Visit (INDEPENDENT_AMBULATORY_CARE_PROVIDER_SITE_OTHER): Payer: 59 | Admitting: *Deleted

## 2018-10-02 DIAGNOSIS — Z23 Encounter for immunization: Secondary | ICD-10-CM | POA: Diagnosis not present

## 2018-10-11 ENCOUNTER — Telehealth: Payer: Self-pay | Admitting: Internal Medicine

## 2018-10-11 NOTE — Telephone Encounter (Signed)
Returned patient's call. No answer, left message for patient to call back to schedule.

## 2018-10-11 NOTE — Telephone Encounter (Signed)
Should probably be seen - can she see Vernona RiegerLaura or Raynelle FanningJulie today?

## 2018-10-11 NOTE — Telephone Encounter (Signed)
[facility]: 10/11/2018 @ 12:54pm  Patient states that she had a Shingrix vaccine last week and now has a rash on her face ("looks like a blister burst. Looks like a pimple."). Please advise.

## 2018-10-11 NOTE — Telephone Encounter (Signed)
Please advise 

## 2018-10-12 ENCOUNTER — Ambulatory Visit: Payer: 59 | Admitting: Family

## 2018-10-12 ENCOUNTER — Encounter: Payer: Self-pay | Admitting: Family

## 2018-10-12 VITALS — BP 120/74 | HR 96 | Temp 97.9°F | Ht 61.0 in | Wt 180.1 lb

## 2018-10-12 DIAGNOSIS — B029 Zoster without complications: Secondary | ICD-10-CM

## 2018-10-12 MED ORDER — VALACYCLOVIR HCL 1 G PO TABS: 1000.0000 mg | ORAL_TABLET | Freq: Three times a day (TID) | ORAL | 0 refills | Status: DC

## 2018-10-12 MED ORDER — PREDNISONE 20 MG PO TABS: 20.0000 mg | ORAL_TABLET | Freq: Every day | ORAL | 0 refills | Status: DC

## 2018-10-12 NOTE — Progress Notes (Signed)
Sandra [name] is a 67 y.o. female with the following history as recorded in EpicCare:  Patient Active Problem List   Diagnosis Date Noted  . Genital herpes 03/23/2018  . Prediabetes 03/25/2017  . Knee swelling 06/16/2016  . Obesity (BMI 30-39.9) 05/02/2014  . Edema 09/16/2013  . Vitamin D deficiency 03/01/2013  . Osteopenia 03/30/2010  . Hyperlipidemia 03/17/2008    Current Outpatient Medications  Medication Sig Dispense Refill  . Calcium Carbonate-Vitamin D (CALCIUM 600+D) 600-400 MG-UNIT tablet Take 1 tablet by mouth 2 (two) times daily.    Marland Kitchen ezetimibe (ZETIA) 10 MG tablet Take 1 tablet (10 mg total) by mouth daily. 90 tablet 1  . hydrochlorothiazide (MICROZIDE) 12.5 MG capsule Take 1 capsule (12.5 mg total) by mouth daily. 90 capsule 1  . predniSONE (DELTASONE) 20 MG tablet Take 1 tablet (20 mg total) by mouth daily with breakfast. 5 tablet 0  . rosuvastatin (CRESTOR) 20 MG tablet TAKE 1 TABLET (20 MG TOTAL) BY MOUTH DAILY. 90 tablet 1  . valACYclovir (VALTREX) 1000 MG tablet Take 1 tablet (1,000 mg total) by mouth 3 (three) times daily. 21 tablet 0   No current facility-administered medications for this visit.     Allergies: Patient has no known allergies.  Past Medical History:  Diagnosis Date  . Hyperlipidemia   . Osteopenia    ??    Past Surgical History:  Procedure Laterality Date  . CATARACT EXTRACTION Bilateral   . COLONOSCOPY     Dr Jeralene Peters  . DILATION AND CURETTAGE OF UTERUS    . G0P0    . WISDOM TOOTH EXTRACTION      Family History  Problem Relation Age of Onset  . Stroke Father        Alsheimer's  . Diabetes Other        mat. & pat sides  . Hypertension Other        mat & pat sides  . Heart disease Mother        irreg heart rate & heart failure  . Hyperlipidemia Sister   . Glaucoma Sister   . Hyperlipidemia Brother   . Cancer Paternal Aunt        X 2 had breast cncer; 1 aunt cervical cancer  . Heart attack Maternal Grandmother        < 65  .  Glaucoma Maternal Aunt   . Macular degeneration Maternal Aunt   . Hypertension Maternal Aunt   . Asthma Maternal Aunt     Social History   Tobacco Use  . Smoking status: Never Smoker  . Smokeless tobacco: Never Used  Substance Use Topics  . Alcohol use: Yes    Comment: 4-6 beers a day    Subjective:  Presents with concerns for possible reaction to Shingrix; received vaccine on 10/02/2018; has since developed a "blister type" bump on the left side of her face; notes that area of concern developed on left lower chin on Wednesday night; no fever; no vision changes;    Objective:  Vitals:   10/12/18 0825  BP: 120/74  Pulse: 96  Temp: 97.9 F (36.6 C)  TempSrc: Oral  SpO2: 98%  Weight: 180 lb 1.3 oz (81.7 kg)  Height: 5\' 1"  (1.549 m)    General: Well developed, well nourished, in no acute distress  Skin : Warm and dry. Localized area of erythema, vesicular lesions noted over lower left shin Head: Normocephalic and atraumatic  Eyes: Sclera and conjunctiva clear; pupils round and reactive to  light; extraocular movements intact  Ears: External normal; canals clear; tympanic membranes normal  Oropharynx: Pink, supple. No suspicious lesions  Neck: Supple without thyromegaly, adenopathy  Lungs: Respirations unlabored;  Neurologic: Alert and oriented; speech intact; face symmetrical; moves all extremities well; CNII-XII intact without focal deficit   Assessment:  1. Herpes zoster without complication     Plan:  ? Localized reaction from recent Shingrix; Rx for Valtrex and Prednisone- take as directed; explained to patient that we will reach out to our vaccine representative for further clarification about how she should proceed with 2nd vaccine; work note given as requested.   No follow-ups on file.  No orders of the defined types were placed in this encounter.   Requested Prescriptions   Signed Prescriptions Disp Refills  . valACYclovir (VALTREX) 1000 MG tablet 21 tablet 0     Sig: Take 1 tablet (1,000 mg total) by mouth 3 (three) times daily.  . predniSONE (DELTASONE) 20 MG tablet 5 tablet 0    Sig: Take 1 tablet (20 mg total) by mouth daily with breakfast.

## 2018-12-11 ENCOUNTER — Telehealth: Payer: Self-pay | Admitting: Internal Medicine

## 2018-12-11 NOTE — Telephone Encounter (Signed)
Patient has already been seen for rash on 10/12/18 with laura murray---patient is wanting to know if/when she should get 2nd vaccine----I will need to talk with laura murray,NP for advisement and call patient back

## 2018-12-11 NOTE — Telephone Encounter (Signed)
Copied from CRM [id number]. Topic: General - Other >> Dec 11, 2018  3:46 PM Jaquita Rector A wrote: Reason for CRM: Patient called to cancel appointment for second shingrix shot. She stated that she had a reaction from first shot and someone was to get back in contact with her after talking to the manufacturer of the vaccine. Patient said she is waiting for the nurse to call her back so that she can speak to her and reschedule the appointment for the next shot. Please advise Ph# [phone]

## 2018-12-12 ENCOUNTER — Ambulatory Visit: Payer: 59

## 2018-12-12 NOTE — Telephone Encounter (Signed)
Routing back to laura---per dawn wilson with [employer]----research was not conducted where shingrix was re-introduced (2nd vaccine given) after a reaction with 1st vaccination---however, it's not contraindicated, but just not researched that way---there are no guidelines for timing of vaccines after suspected reaction, either.  It should be determined at this point by the patient's provider as to whether a 2nd vaccine should be given----please advise, I will call patient back, thanks

## 2018-12-12 NOTE — Telephone Encounter (Signed)
Routing to laura, please advise, I will call patient back, thanks

## 2018-12-12 NOTE — Telephone Encounter (Signed)
Delaney Meigs, can you call the Shingrix representative and get clarification? I think it should be okay but would prefer their thoughts.  Please let them know that localized area of concern was on chin- not at site; ? If it was truly shinlges or herpes zoster.

## 2018-12-14 NOTE — Telephone Encounter (Signed)
Please let her know that we have talked to the vaccine manufacturer and I have discussed with Dr. Lawerance Bach. There is no definite information/ formal recommendation about what to in a situation like this. Ultimately, it is up to her but our thought is that she is okay to take the vaccine. Unfortunately, we have no way to know for sure but because her first reaction was so mild, we would think she would be okay with the second vaccine. If she chooses not to take the vaccine, we understand that also.

## 2018-12-14 NOTE — Telephone Encounter (Signed)
I would say - there are no formal recommendations about whether she should have the second vaccine or not. Ultimately it is up to her.    It sounds like this was a mild reaction and therefore she can still have the vaccine and get the full immunity to prevent singles.  Of course there is always a possibility she could have a reaction, but this is not studied enough to know for sure.    There is no study to know if waiting the full 6 months between vaccines is better than getting it earlier, but it may be worth delaying the second vaccine until 5-6 months after the first.  Ideally she should have the second vaccine.

## 2018-12-14 NOTE — Telephone Encounter (Signed)
Dr. Lawerance Bach, can you give your thoughts on this? I will also write this up to cover our January meeting for the nursing board. Thanks-  I am inclined to say it is fine to get the vaccine but I don't know the patient.

## 2018-12-17 NOTE — Telephone Encounter (Signed)
Called and left message today for patient on her voicemail with info.

## 2018-12-24 ENCOUNTER — Ambulatory Visit (INDEPENDENT_AMBULATORY_CARE_PROVIDER_SITE_OTHER): Payer: 59

## 2018-12-24 DIAGNOSIS — Z23 Encounter for immunization: Secondary | ICD-10-CM | POA: Diagnosis not present

## 2018-12-24 DIAGNOSIS — Z299 Encounter for prophylactic measures, unspecified: Secondary | ICD-10-CM

## 2019-02-16 ENCOUNTER — Encounter (HOSPITAL_COMMUNITY): Payer: Self-pay

## 2019-02-16 ENCOUNTER — Other Ambulatory Visit: Payer: Self-pay

## 2019-02-16 ENCOUNTER — Ambulatory Visit (HOSPITAL_COMMUNITY)
Admission: EM | Admit: 2019-02-16 | Discharge: 2019-02-16 | Disposition: A | Discharge status: Home / Self Care | Payer: 59 | Attending: Family Medicine | Admitting: Family Medicine

## 2019-02-16 DIAGNOSIS — B853 Phthiriasis: Secondary | ICD-10-CM | POA: Diagnosis not present

## 2019-02-16 MED ORDER — PERMETHRIN 5 % EX CREA: 1.0000 "application " | TOPICAL_CREAM | Freq: Once | CUTANEOUS | 0 refills | Status: AC

## 2019-02-16 NOTE — Discharge Instructions (Signed)
Permethrin 1% cream rinse prescribed.  Ensure skin is cool and dry before application.  Apply cream to suspected affected areas.  Wash off after 10 minutes.  Remove nits with fingernails, a nit comb, or tweezers.  Put on clean underwear and clothing following treatment Follow up with GYN in 1 week to reassess and ensure symptoms have resolved Return here or go to ER if you have any new or worsening symptoms fever, chills, nausea, vomiting, abdominal or pelvic pain, painful intercourse, vaginal discharge, vaginal bleeding, persistent symptoms despite treatment, etc..Marland Kitchen

## 2019-02-16 NOTE — ED Provider Notes (Signed)
[facility]   [id number] 02/16/19 Arrival Time: 1124   [id number] itching  SUBJECTIVE:  Sandra [name] is a 68 y.o. female who presents with complaint of external vaginal itching x 1 week.  Admits to sexual activity prior to symptoms.  Patient is sexually active with 1 female partner.  Partner without symptoms.  Has tried shaving without relief.  She reports similar symptoms in the past and treated for pubic lice.  She denies fever, chills, nausea, vomiting, abdominal or pelvic pain, urinary symptoms, vaginal discharge, vaginal odor, vaginal bleeding, dyspareunia, vaginal rashes or lesions.   No LMP recorded. Patient is postmenopausal.  ROS: As per HPI.  Past Medical History:  Diagnosis Date  . Hyperlipidemia   . Osteopenia    ??   Past Surgical History:  Procedure Laterality Date  . CATARACT EXTRACTION Bilateral   . COLONOSCOPY     Dr Jeralene Peters  . DILATION AND CURETTAGE OF UTERUS    . G0P0    . WISDOM TOOTH EXTRACTION     No Known Allergies No current facility-administered medications on file prior to encounter.    Current Outpatient Medications on File Prior to Encounter  Medication Sig Dispense Refill  . Calcium Carbonate-Vitamin D (CALCIUM 600+D) 600-400 MG-UNIT tablet Take 1 tablet by mouth 2 (two) times daily.    Marland Kitchen ezetimibe (ZETIA) 10 MG tablet Take 1 tablet (10 mg total) by mouth daily. 90 tablet 1  . hydrochlorothiazide (MICROZIDE) 12.5 MG capsule Take 1 capsule (12.5 mg total) by mouth daily. 90 capsule 1  . rosuvastatin (CRESTOR) 20 MG tablet TAKE 1 TABLET (20 MG TOTAL) BY MOUTH DAILY. 90 tablet 1    Social History   Socioeconomic History  . Marital status: Divorced    Spouse name: Not on file  . Number of children: Not on file  . Years of education: Not on file  . Highest education level: Not on file  Occupational History  . Occupation: Retail buyer & part-time dietary aide    Employer: UNEMPLOYED  Social Needs  . Financial resource strain:  Not on file  . Food insecurity:    Worry: Not on file    Inability: Not on file  . Transportation needs:    Medical: Not on file    Non-medical: Not on file  Tobacco Use  . Smoking status: Never Smoker  . Smokeless tobacco: Never Used  Substance and Sexual Activity  . Alcohol use: Yes    Comment: 4-6 beers a day  . Drug use: No  . Sexual activity: Not on file  Lifestyle  . Physical activity:    Days per week: Not on file    Minutes per session: Not on file  . Stress: Not on file  Relationships  . Social connections:    Talks on phone: Not on file    Gets together: Not on file    Attends religious service: Not on file    Active member of club or organization: Not on file    Attends meetings of clubs or organizations: Not on file    Relationship status: Not on file  . Intimate partner violence:    Fear of current or ex partner: Not on file    Emotionally abused: Not on file    Physically abused: Not on file    Forced sexual activity: Not on file  Other Topics Concern  . Not on file  Social History Narrative   No regular exercise   Family History  Problem  Relation Age of Onset  . Stroke Father        Alsheimer's  . Diabetes Other        mat. & pat sides  . Hypertension Other        mat & pat sides  . Heart disease Mother        irreg heart rate & heart failure  . Hyperlipidemia Sister   . Glaucoma Sister   . Hyperlipidemia Brother   . Cancer Paternal Aunt        X 2 had breast cncer; 1 aunt cervical cancer  . Heart attack Maternal Grandmother        < 65  . Glaucoma Maternal Aunt   . Macular degeneration Maternal Aunt   . Hypertension Maternal Aunt   . Asthma Maternal Aunt     OBJECTIVE:  Vitals:   02/16/19 1147 02/16/19 1148  BP:  126/85  Pulse:  78  Resp:  17  Temp:  98 F (36.7 C)  TempSrc:  Oral  SpO2:  100%  Height: 5\' 2"  (1.575 m)      General appearance: Alert, NAD, appears stated age Head: NCAT Throat: lips, mucosa, and tongue normal;  teeth and gums normal Lungs: CTA bilaterally without adventitious breath sounds Heart: regular rate and rhythm.  Radial pulses 2+ symmetrical bilaterally Back: no CVA tenderness Abdomen: soft, non-tender; bowel sounds normal; no masses or organomegaly; no guarding or rebound tenderness GU: Declines chaperone; External examination positive for black papules on bilateral labia majora, NTTP, no obvious drainage or bleeding, no surrounding erythema; Bimanual exam: deferred; Speculum exam: deferred Skin: warm and dry Psychological:  Alert and cooperative. Normal mood and affect.  ASSESSMENT & PLAN:  1. Pediculosis pubis     Meds ordered this encounter  Medications  . permethrin (ELIMITE) 5 % cream    Sig: Apply 1 application topically once for 1 dose.    Dispense:  60 g    Refill:  0    Order Specific Question:   Supervising Provider    Answer:   Eustace Moore [[id number]]    Permethrin 1% cream rinse prescribed.  Ensure skin is cool and dry before application.  Apply cream to suspected affected areas.  Wash off after 10 minutes.  Remove nits with fingernails, a nit comb, or tweezers.  Put on clean underwear and clothing following treatment Follow up with GYN in 1 week to reassess and ensure symptoms have resolved Return here or go to ER if you have any new or worsening symptoms fever, chills, nausea, vomiting, abdominal or pelvic pain, painful intercourse, vaginal discharge, vaginal bleeding, persistent symptoms despite treatment, etc...  Reviewed expectations re: course of current medical issues. Questions answered. Outlined signs and symptoms indicating need for more acute intervention. Patient verbalized understanding. After Visit Summary given.       Rennis Harding, PA-C 02/16/19 1333

## 2019-02-16 NOTE — ED Triage Notes (Signed)
Patient presents to Urgent Care with complaints of vaginal itching since about a week ago. Patient states she thinks she may have lice in her pubic hair, did try to shave this morning but could not see well enough.

## 2019-03-26 ENCOUNTER — Ambulatory Visit (INDEPENDENT_AMBULATORY_CARE_PROVIDER_SITE_OTHER): Payer: 59 | Admitting: Internal Medicine

## 2019-03-26 ENCOUNTER — Encounter: Payer: Self-pay | Admitting: Internal Medicine

## 2019-03-26 ENCOUNTER — Encounter: Payer: 59 | Admitting: Internal Medicine

## 2019-03-26 DIAGNOSIS — R7303 Prediabetes: Secondary | ICD-10-CM

## 2019-03-26 DIAGNOSIS — F1011 Alcohol abuse, in remission: Secondary | ICD-10-CM | POA: Insufficient documentation

## 2019-03-26 DIAGNOSIS — R600 Localized edema: Secondary | ICD-10-CM

## 2019-03-26 DIAGNOSIS — E669 Obesity, unspecified: Secondary | ICD-10-CM | POA: Diagnosis not present

## 2019-03-26 DIAGNOSIS — F1010 Alcohol abuse, uncomplicated: Secondary | ICD-10-CM | POA: Insufficient documentation

## 2019-03-26 DIAGNOSIS — E782 Mixed hyperlipidemia: Secondary | ICD-10-CM

## 2019-03-26 MED ORDER — EZETIMIBE 10 MG PO TABS: 10.0000 mg | ORAL_TABLET | Freq: Every day | ORAL | 1 refills | Status: DC

## 2019-03-26 MED ORDER — ROSUVASTATIN CALCIUM 20 MG PO TABS: ORAL_TABLET | ORAL | 1 refills | Status: DC

## 2019-03-26 MED ORDER — HYDROCHLOROTHIAZIDE 12.5 MG PO CAPS: 12.5000 mg | ORAL_CAPSULE | Freq: Every day | ORAL | 1 refills | Status: DC

## 2019-03-26 NOTE — Assessment & Plan Note (Signed)
Still drinking nightly but trying to cut down - has been drinking a little less She knows she needs to cut down

## 2019-03-26 NOTE — Progress Notes (Signed)
Failed Virtual Visit via Video Note - converted to telephone note after she was unable to connect  I connected with Sandra [name] on 03/26/19 at  2:00 PM EDT by telephne and verified that I am speaking with the correct person using two identifiers.   I discussed the limitations of evaluation and management by telemedicine and the availability of in person appointments. The patient expressed understanding and agreed to proceed.  The patient is currently at home and I am in the office.    No referring provider.    History of Present Illness: She is here for follow up of her chronic medical conditions.   She is not exercising regularly.     Prediabetes:  She is compliant with a low sugar diet, but needs to cut down on the carbohydrates.  She is not exercising regularly.  Hyperlipidemia: She is taking her medication daily. She is compliant with a low fat/cholesterol diet. She denies myalgias.   Leg edema:  She takes hctz 12.5 mg daiy.  Her leg edema is controlled.  Alcohol abuse:  She drinks nightly - she was drinking about 4-6 beers at night, but she is trying to cut down.    Obesity: she did initially gained some weight and is trying to lose back.  She is not currently exercising regularly, but trying to do some walking here and there.  She knows she needs to decrease the carbohydrates.  She is still drinking her nightly basis and working on decreasing her alcohol intake.   Social History   Socioeconomic History  . Marital status: Divorced    Spouse name: Not on file  . Number of children: Not on file  . Years of education: Not on file  . Highest education level: Not on file  Occupational History  . 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 Financial resource strain: Not on file  . Food insecurity:    Worry: Not on file    Inability: Not on file  . Transportation needs:    Medical: Not on file    Non-medical: Not on  file  Tobacco Use  . Smoking status: Never Smoker  . Smokeless tobacco: Never Used  Substance and Sexual Activity  . Alcohol use: Yes    Comment: 4-6 beers a day  . Drug use: No  . Sexual activity: Not on file  Lifestyle  . Physical activity:    Days per week: Not on file    Minutes per session: Not on file  . Stress: Not on file  Relationships  . Social connections:    Talks on phone: Not on file    Gets together: Not on file    Attends religious service: Not on file    Active member of club or organization: Not on file    Attends meetings of clubs or organizations: Not on file    Relationship status: Not on file  Other Topics Concern  . Not on file  Social History Narrative   No regular exercise     Lab Results  Component Value Date   WBC 9.0 04/09/2018   HGB 13.0 04/09/2018   HCT 38.9 04/09/2018   PLT 242.0 04/09/2018   GLUCOSE 89 09/25/2018   CHOL 221 (H) 09/25/2018   TRIG 152.0 (H) 09/25/2018   HDL 83.00 09/25/2018   LDLDIRECT 145.0 02/21/2013   LDLCALC 107 (H) 09/25/2018   ALT 19 09/25/2018   AST 26 09/25/2018  NA 140 09/25/2018   K 3.8 09/25/2018   CL 104 09/25/2018   CREATININE 0.97 09/25/2018   BUN 14 09/25/2018   CO2 27 09/25/2018   TSH 1.63 04/09/2018   INR 1.08 06/05/2016   HGBA1C 6.1 09/25/2018    Assessment and Plan:  See Problem List for Assessment and Plan of chronic medical problems.   Follow Up Instructions:    I discussed the assessment and treatment plan with the patient. The patient was provided an opportunity to ask questions and all were answered. The patient agreed with the plan and demonstrated an understanding of the instructions.   The patient was advised to call back or seek an in-person evaluation if the symptoms worsen or if the condition fails to improve as anticipated.  Follow up in august  Pincus Sanes, MD

## 2019-03-26 NOTE — Assessment & Plan Note (Signed)
Controlled with hctz 12.5 mg daily Continue same

## 2019-03-26 NOTE — Assessment & Plan Note (Signed)
Lab Results  Component Value Date   HGBA1C 6.1 09/25/2018    Try to start exercising regularly Low sugar/carb diet

## 2019-03-26 NOTE — Assessment & Plan Note (Signed)
Lab Results  Component Value Date   CHOL 221 (H) 09/25/2018   HDL 83.00 09/25/2018   LDLCALC 107 (H) 09/25/2018   LDLDIRECT 145.0 02/21/2013   TRIG 152.0 (H) 09/25/2018   CHOLHDL 3 09/25/2018   Continue crestor and zetia

## 2019-03-26 NOTE — Assessment & Plan Note (Signed)
Will work on [employer] loss Start regular exercise Work on decreasing alcohol

## 2019-06-27 NOTE — Patient Instructions (Addendum)
Tests ordered today. Your results will be released to MyChart (or called to you) after review.  If any changes need to be made, you will be notified at that same time.  All other Health Maintenance issues reviewed.   All recommended immunizations and age-appropriate screenings are up-to-date or discussed.  No immunization administered today.   Medications reviewed and updated.  Changes include :   none  A referral was ordered for Dermatology.  Please followup in 6 months    Health Maintenance, Female Adopting a healthy lifestyle and getting preventive care are important in promoting health and wellness. Ask your health care provider about:  The right schedule for you to have regular tests and exams.  Things you can do on your own to prevent diseases and keep yourself healthy. What should I know about diet, weight, and exercise? Eat a healthy diet   Eat a diet that includes plenty of vegetables, fruits, low-fat dairy products, and lean protein.  Do not eat a lot of foods that are high in solid fats, added sugars, or sodium. Maintain a healthy weight Body mass index (BMI) is used to identify weight problems. It estimates body fat based on height and weight. Your health care provider can help determine your BMI and help you achieve or maintain a healthy weight. Get regular exercise Get regular exercise. This is one of the most important things you can do for your health. Most adults should:  Exercise for at least 150 minutes each week. The exercise should increase your heart rate and make you sweat (moderate-intensity exercise).  Do strengthening exercises at least twice a week. This is in addition to the moderate-intensity exercise.  Spend less time sitting. Even light physical activity can be beneficial. Watch cholesterol and blood lipids Have your blood tested for lipids and cholesterol at 66 years of age, then have this test every 5 years. Have your cholesterol levels  checked more often if:  Your lipid or cholesterol levels are high.  You are older than 66 years of age.  You are at high risk for heart disease. What should I know about cancer screening? Depending on your health history and family history, you may need to have cancer screening at various ages. This may include screening for:  Breast cancer.  Cervical cancer.  Colorectal cancer.  Skin cancer.  Lung cancer. What should I know about heart disease, diabetes, and high blood pressure? Blood pressure and heart disease  High blood pressure causes heart disease and increases the risk of stroke. This is more likely to develop in people who have high blood pressure readings, are of African descent, or are overweight.  Have your blood pressure checked: ? Every 3-5 years if you are 5318-239 years of age. ? Every year if you are 66 years old or older. Diabetes Have regular diabetes screenings. This checks your fasting blood sugar level. Have the screening done:  Once every three years after age 66 if you are at a normal weight and have a low risk for diabetes.  More often and at a younger age if you are overweight or have a high risk for diabetes. What should I know about preventing infection? Hepatitis B If you have a higher risk for hepatitis B, you should be screened for this virus. Talk with your health care provider to find out if you are at risk for hepatitis B infection. Hepatitis C Testing is recommended for:  Everyone born from [birth year] through 1963.  Anyone with known  risk factors for hepatitis C. Sexually transmitted infections (STIs)  Get screened for STIs, including gonorrhea and chlamydia, if: ? You are sexually active and are younger than 66 years of age. ? You are older than 66 years of age and your health care provider tells you that you are at risk for this type of infection. ? Your sexual activity has changed since you were last screened, and you are at increased risk  for chlamydia or gonorrhea. Ask your health care provider if you are at risk.  Ask your health care provider about whether you are at high risk for HIV. Your health care provider may recommend a prescription medicine to help prevent HIV infection. If you choose to take medicine to prevent HIV, you should first get tested for HIV. You should then be tested every 3 months for as long as you are taking the medicine. Pregnancy  If you are about to stop having your period (premenopausal) and you may become pregnant, seek counseling before you get pregnant.  Take 400 to 800 micrograms (mcg) of folic acid every day if you become pregnant.  Ask for birth control (contraception) if you want to prevent pregnancy. Osteoporosis and menopause Osteoporosis is a disease in which the bones lose minerals and strength with aging. This can result in bone fractures. If you are 38 years old or older, or if you are at risk for osteoporosis and fractures, ask your health care provider if you should:  Be screened for bone loss.  Take a calcium or vitamin D supplement to lower your risk of fractures.  Be given hormone replacement therapy (HRT) to treat symptoms of menopause. Follow these instructions at home: Lifestyle  Do not use any products that contain nicotine or tobacco, such as cigarettes, e-cigarettes, and chewing tobacco. If you need help quitting, ask your health care provider.  Do not use street drugs.  Do not share needles.  Ask your health care provider for help if you need support or information about quitting drugs. Alcohol use  Do not drink alcohol if: ? Your health care provider tells you not to drink. ? You are pregnant, may be pregnant, or are planning to become pregnant.  If you drink alcohol: ? Limit how much you use to 0-1 drink a day. ? Limit intake if you are breastfeeding.  Be aware of how much alcohol is in your drink. In the [place] one drink equals one 12 oz bottle of beer (355  mL), one 5 oz glass of wine (148 mL), or one 1 oz glass of hard liquor (44 mL). General instructions  Schedule regular health, dental, and eye exams.  Stay current with your vaccines.  Tell your health care provider if: ? You often feel depressed. ? You have ever been abused or do not feel safe at home. Summary  Adopting a healthy lifestyle and getting preventive care are important in promoting health and wellness.  Follow your health care provider's instructions about healthy diet, exercising, and getting tested or screened for diseases.  Follow your health care provider's instructions on monitoring your cholesterol and blood pressure. This information is not intended to replace advice given to you by your health care provider. Make sure you discuss any questions you have with your health care provider. Document Released: 05/23/2011 Document Revised: 10/31/2018 Document Reviewed: 10/31/2018 Elsevier Patient Education  2020 [employer].

## 2019-06-27 NOTE — Progress Notes (Signed)
Subjective:    Patient ID: Sandra [name], female    DOB: 11/05/1951, 68 y.o.   MRN: [id number]  HPI Here for a physical exam  Care team reviewed  - GYN - Dr Arelia SneddonMcComb, [place] - Dr Hazle Quantigby, ortho - Dr Lajoyce Cornersuda    Are there smokers in your home (other than you)? No  Risk Factors Exercise: walking some- knows she needs to be doing more Dietary issues discussed:  Eating poorly - eating more meat, getting a lot of take-out, not eating as much oatmeal and vegetables  Vitamin and supplement use:   Taking calcium and vitamin  Opiod use:   N/A Side effects from medication: n/a  Does medications benefits outweigh risks/side effects: n/a  Cardiac risk factors: advanced age, prediabetes, hyperlipidemia, and obesity  Depression Screen  Have you felt down, depressed or hopeless? No  Have you felt little interest or pleasure in doing things?  No  Activities of Daily Living In your present state of health, do you have any difficulty performing the following activities?:  Driving? No Managing money?  No Feeding yourself? No Getting from bed to chair? No Climbing a flight of stairs? No Preparing food and eating?: No Bathing or showering? No Getting dressed: No Getting to/using the toilet? No Moving around from place to place: No In the past year have you fallen or had a near fall?: No   Are you sexually active?  yes  Do you have more than one partner?  no  Hearing Difficulties: No Do you often ask people to speak up or repeat themselves? No Do you experience ringing or noises in your ears? yes Do you have difficulty understanding soft or whispered voices? yes Vision:              Any change in vision:  No             Up to date with eye exam:   Yes   Memory:  Do you feel that you have a problem with memory? No  Do you often misplace items? Yes  Do you feel safe at home?  Yes  Cognitive Testing  Alert, Orientated? Yes  Normal Appearance? Yes  Recall of three objects?  Yes  Can  perform simple calculations? Yes  Displays appropriate judgment? Yes  Can read the correct time from a watch face? Yes   Advanced Directives have been discussed with the patient? Yes - not in place   She has not been exercising as much as she should, but she is doing some walking.  She has gained some weight.  She is not eating like she was-she was eating more vegetarian and has been eating more meat and more junk food.  She is still drinking more alcohol than she should.  She knows what she needs to do.  Medications and allergies reviewed with patient and updated if appropriate.  Patient Active Problem List   Diagnosis Date Noted  . Alcohol abuse 03/26/2019  . Genital herpes 03/23/2018  . Prediabetes 03/25/2017  . Knee swelling 06/16/2016  . Obesity (BMI 30-39.9) 05/02/2014  . Edema 09/16/2013  . Vitamin D deficiency 03/01/2013  . Osteopenia 03/30/2010  . Hyperlipidemia 03/17/2008    Current Outpatient Medications on File Prior to Visit  Medication Sig Dispense Refill  . Calcium Carbonate-Vitamin D (CALCIUM 600+D) 600-400 MG-UNIT tablet Take 1 tablet by mouth 2 (two) times daily.    Marland Kitchen. ezetimibe (ZETIA) 10 MG tablet Take 1 tablet (10 mg total)  by mouth daily. 90 tablet 1  . hydrochlorothiazide (MICROZIDE) 12.5 MG capsule Take 1 capsule (12.5 mg total) by mouth daily. 90 capsule 1  . rosuvastatin (CRESTOR) 20 MG tablet TAKE 1 TABLET (20 MG TOTAL) BY MOUTH DAILY. 90 tablet 1   No current facility-administered medications on file prior to visit.     Past Medical History:  Diagnosis Date  . Hyperlipidemia   . Osteopenia    ??    Past Surgical History:  Procedure Laterality Date  . CATARACT EXTRACTION Bilateral   . COLONOSCOPY     Dr Jeralene PetersGanim  . DILATION AND CURETTAGE OF UTERUS    . G0P0    . WISDOM TOOTH EXTRACTION      Social History   Socioeconomic History  . Marital status: Divorced    Spouse name: Not on file  . Number of children: Not on file  . Years of  education: Not on file  . Highest education level: Not on file  Occupational History  . 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 Financial resource strain: Not on file  . Food insecurity    Worry: Not on file    Inability: Not on file  . Transportation needs    Medical: Not on file    Non-medical: Not on file  Tobacco Use  . Smoking status: Never Smoker  . Smokeless tobacco: Never Used  Substance and Sexual Activity  . Alcohol use: Yes    Comment: 4-6 beers a day  . Drug use: No  . Sexual activity: Not on file  Lifestyle  . Physical activity    Days per week: Not on file    Minutes per session: Not on file  . Stress: Not on file  Relationships  . Social Musicianconnections    Talks on phone: Not on file    Gets together: Not on file    Attends religious service: Not on file    Active member of club or organization: Not on file    Attends meetings of clubs or organizations: Not on file    Relationship status: Not on file  Other Topics Concern  . Not on file  Social History Narrative   No regular exercise    Family History  Problem Relation Age of Onset  . Stroke Father        Alsheimer's  . Diabetes Other        mat. & pat sides  . Hypertension Other        mat & pat sides  . Heart disease Mother        irreg heart rate & heart failure  . Hyperlipidemia Sister   . Glaucoma Sister   . Hyperlipidemia Brother   . Cancer Paternal Aunt        X 2 had breast cncer; 1 aunt cervical cancer  . Heart attack Maternal Grandmother        < 65  . Glaucoma Maternal Aunt   . Macular degeneration Maternal Aunt   . Hypertension Maternal Aunt   . Asthma Maternal Aunt     Review of Systems  Constitutional: Negative for chills and fever.  HENT: Positive for hearing loss (minimal) and tinnitus.   Eyes: Negative for visual disturbance.  Respiratory: Negative for cough, shortness of breath and wheezing.   Cardiovascular:  Positive for palpitations (occ) and leg swelling (mild). Negative for chest pain.  Gastrointestinal: Positive for constipation. Negative for abdominal  pain, blood in stool, diarrhea and nausea.       GERD with spicy foods  Genitourinary: Negative for dysuria and hematuria.  Musculoskeletal: Negative for arthralgias and back pain.  Skin: Negative for color change and rash.  Psychiatric/Behavioral: Negative for dysphoric mood. The patient is not nervous/anxious.        Objective:   Vitals:   06/28/19 1403  BP: 110/72  Pulse: 80  Resp: 16  Temp: 98.2 F (36.8 C)  SpO2: 94%   Filed Weights   06/28/19 1403  Weight: 189 lb (85.7 kg)   Body mass index is 34.57 kg/m.  BP Readings from Last 3 Encounters:  06/28/19 110/72  02/16/19 126/85  10/12/18 120/74    Wt Readings from Last 3 Encounters:  06/28/19 189 lb (85.7 kg)  10/12/18 180 lb 1.3 oz (81.7 kg)  09/25/18 177 lb 12.8 oz (80.6 kg)       Office Visit from 06/28/2019 in [place]  PHQ-2 Total Score  0        Physical Exam Constitutional: She appears well-developed and well-nourished. No distress.  HENT:  Head: Normocephalic and atraumatic.  Right Ear: External ear normal. Normal ear canal and TM Left Ear: External ear normal.  Normal ear canal and TM Mouth/Throat: Oropharynx is clear and moist.  Eyes: Conjunctivae and EOM are normal.  Neck: Neck supple. No tracheal deviation present. No thyromegaly present.  No carotid bruit  Cardiovascular: Normal rate, regular rhythm and normal heart sounds.   No murmur heard.  No edema. Pulmonary/Chest: Effort normal and breath sounds normal. No respiratory distress. She has no wheezes. She has no rales.  Breast: deferred   Abdominal: Soft.  Obese.  She exhibits no distension. There is no tenderness.  No hepatosplenomegaly. Lymphadenopathy: She has no cervical adenopathy.  Skin: Skin is warm and dry. She is not diaphoretic.  Psychiatric: She has  a normal mood and affect. Her behavior is normal.        Assessment & Plan:   Health Maintenance  Topic Date Due  . TETANUS/TDAP  08/10/1972  . INFLUENZA VACCINE  06/22/2019  . PNA vac Low Risk Adult (2 of 2 - PPSV23) 09/26/2019  . PAP SMEAR-Modifier  08/14/2020  . DEXA SCAN  08/17/2020  . MAMMOGRAM  09/11/2020  . COLONOSCOPY  10/03/2026  . Hepatitis C Screening  Completed  . HIV Screening  Completed     Physical exam: Screening blood work ordered Immunizations  tdap done at gyn < 10 years - will get record, discussed flu, others up to date Colonoscopy  Up to date  Mammogram   Up to date - Due 08/2019  Gyn   Up to date   - Dr Radene Knee Dexa    Up to date  Eye exams   Up to date  Exercise  Walking some, but not enough Weight  Advised weight loss Skin no concerns Substance abuse  Abusing alcohol 4-6 beers a night - it fluctuates - she needs to decrease her alcohol intake and she is aware of this.  Stressed the importance and discussed her risk of liver cirrhosis, especially with her being overweight  Screening for depression is negative.  No symptoms or signs suggestive of depression.   See Problem List for Assessment and Plan of chronic medical problems.   Follow-up in 6 months

## 2019-06-28 ENCOUNTER — Ambulatory Visit (INDEPENDENT_AMBULATORY_CARE_PROVIDER_SITE_OTHER): Payer: 59 | Admitting: Internal Medicine

## 2019-06-28 ENCOUNTER — Encounter: Payer: Self-pay | Admitting: Internal Medicine

## 2019-06-28 ENCOUNTER — Other Ambulatory Visit: Payer: Self-pay

## 2019-06-28 ENCOUNTER — Other Ambulatory Visit (INDEPENDENT_AMBULATORY_CARE_PROVIDER_SITE_OTHER): Payer: 59

## 2019-06-28 VITALS — BP 110/72 | HR 80 | Temp 98.2°F | Resp 16 | Ht 62.0 in | Wt 189.0 lb

## 2019-06-28 DIAGNOSIS — R600 Localized edema: Secondary | ICD-10-CM

## 2019-06-28 DIAGNOSIS — Z0000 Encounter for general adult medical examination without abnormal findings: Secondary | ICD-10-CM | POA: Diagnosis not present

## 2019-06-28 DIAGNOSIS — R7303 Prediabetes: Secondary | ICD-10-CM | POA: Diagnosis not present

## 2019-06-28 DIAGNOSIS — F1010 Alcohol abuse, uncomplicated: Secondary | ICD-10-CM

## 2019-06-28 DIAGNOSIS — E782 Mixed hyperlipidemia: Secondary | ICD-10-CM

## 2019-06-28 DIAGNOSIS — M8580 Other specified disorders of bone density and structure, unspecified site: Secondary | ICD-10-CM

## 2019-06-28 DIAGNOSIS — E559 Vitamin D deficiency, unspecified: Secondary | ICD-10-CM

## 2019-06-28 DIAGNOSIS — E669 Obesity, unspecified: Secondary | ICD-10-CM

## 2019-06-28 DIAGNOSIS — Z1283 Encounter for screening for malignant neoplasm of skin: Secondary | ICD-10-CM

## 2019-06-28 LAB — TSH: TSH: 2.04 u[IU]/mL (ref 0.35–4.50)

## 2019-06-28 LAB — LIPID PANEL
Cholesterol: 209 mg/dL — ABNORMAL HIGH (ref 0–200)
HDL: 71.1 mg/dL (ref >39.00)
NonHDL: 137.89
Total CHOL/HDL Ratio: 3
Triglycerides: 219 mg/dL — ABNORMAL HIGH (ref 0.0–149.0)
VLDL: 43.8 mg/dL — ABNORMAL HIGH (ref 0.0–40.0)

## 2019-06-28 LAB — CBC WITH DIFFERENTIAL/PLATELET
Basophils Absolute: 0 10*3/uL (ref 0.0–0.1)
Basophils Relative: 0.6 % (ref 0.0–3.0)
Eosinophils Absolute: 0.1 10*3/uL (ref 0.0–0.7)
Eosinophils Relative: 1.8 % (ref 0.0–5.0)
HCT: 42.3 % (ref 36.0–46.0)
Hemoglobin: 14 g/dL (ref 12.0–15.0)
Lymphocytes Relative: 38.3 % (ref 12.0–46.0)
Lymphs Abs: 2.3 10*3/uL (ref 0.7–4.0)
MCHC: 33.2 g/dL (ref 30.0–36.0)
MCV: 94.2 fl (ref 78.0–100.0)
Monocytes Absolute: 0.6 10*3/uL (ref 0.1–1.0)
Monocytes Relative: 10.4 % (ref 3.0–12.0)
Neutro Abs: 3 10*3/uL (ref 1.4–7.7)
Neutrophils Relative %: 48.9 % (ref 43.0–77.0)
Platelets: 292 10*3/uL (ref 150.0–400.0)
RBC: 4.49 Mil/uL (ref 3.87–5.11)
RDW: 14.1 % (ref 11.5–15.5)
WBC: 6.1 10*3/uL (ref 4.0–10.5)

## 2019-06-28 LAB — COMPREHENSIVE METABOLIC PANEL
ALT: 30 U/L (ref 0–35)
AST: 34 U/L (ref 0–37)
Albumin: 4.2 g/dL (ref 3.5–5.2)
Alkaline Phosphatase: 74 U/L (ref 39–117)
BUN: 7 mg/dL (ref 6–23)
CO2: 28 mEq/L (ref 19–32)
Calcium: 9.8 mg/dL (ref 8.4–10.5)
Chloride: 99 mEq/L (ref 96–112)
Creatinine, Ser: 0.94 mg/dL (ref 0.40–1.20)
GFR: 72.12 mL/min (ref >60.00)
Glucose, Bld: 85 mg/dL (ref 70–99)
Potassium: 3.5 mEq/L (ref 3.5–5.1)
Sodium: 138 mEq/L (ref 135–145)
Total Bilirubin: 0.4 mg/dL (ref 0.2–1.2)
Total Protein: 8.3 g/dL (ref 6.0–8.3)

## 2019-06-28 LAB — LDL CHOLESTEROL, DIRECT: Direct LDL: 120 mg/dL

## 2019-06-28 LAB — HEMOGLOBIN A1C: Hgb A1c MFr Bld: 6.3 % (ref 4.6–6.5)

## 2019-06-28 NOTE — Assessment & Plan Note (Signed)
Controlled with hctz cmp 

## 2019-06-28 NOTE — Assessment & Plan Note (Signed)
Check a1c Low sugar / carb diet Stressed regular exercise   

## 2019-06-28 NOTE — Assessment & Plan Note (Addendum)
Drinking too much Stressed cutting down and avoiding all alcohol Discussed increased risk of liver cirrhosis with her obesity

## 2019-06-28 NOTE — Assessment & Plan Note (Signed)
dexa up to date - done via gyn Walking some - increase exercise Continue calcium and vitamin d daily

## 2019-06-28 NOTE — Assessment & Plan Note (Signed)
Stressed wight loss Increase exercise Decrease portions Healthy diet

## 2019-06-28 NOTE — Assessment & Plan Note (Signed)
Taking vitamin d daily - continue 

## 2019-06-28 NOTE — Assessment & Plan Note (Signed)
Check lipid panel,cmp ,tsh Continue daily statin Regular exercise and healthy diet encouraged  

## 2019-08-08 ENCOUNTER — Other Ambulatory Visit: Payer: Self-pay | Admitting: Obstetrics and Gynecology

## 2019-08-08 DIAGNOSIS — Z1231 Encounter for screening mammogram for malignant neoplasm of breast: Secondary | ICD-10-CM

## 2019-09-06 ENCOUNTER — Ambulatory Visit (INDEPENDENT_AMBULATORY_CARE_PROVIDER_SITE_OTHER): Payer: 59 | Admitting: Internal Medicine

## 2019-09-06 ENCOUNTER — Encounter: Payer: Self-pay | Admitting: Internal Medicine

## 2019-09-06 DIAGNOSIS — J309 Allergic rhinitis, unspecified: Secondary | ICD-10-CM | POA: Diagnosis not present

## 2019-09-06 MED ORDER — ALBUTEROL SULFATE HFA 108 (90 BASE) MCG/ACT IN AERS: 2.0000 | INHALATION_SPRAY | Freq: Four times a day (QID) | RESPIRATORY_TRACT | 2 refills | Status: DC | PRN

## 2019-09-06 NOTE — Assessment & Plan Note (Signed)
Her current symptoms are consistent with probable allergies, possibly mild reactive airway disease secondary to allergies Start Claritin daily or Zyrtec at night Can add Flonase Discussed trying an albuterol inhaler-she is not sure if she needs that right now, but I will send an inhaler to the pharmacy to have on hold in case she needs it Low risk for COVID and this does not sound like it since it has been going on for a few weeks No evidence of an infection If her symptoms do not improve with the above measures she will let me know

## 2019-09-06 NOTE — Progress Notes (Signed)
Virtual Visit via telephone Note  I connected with Sandra [name] on 09/06/19 at 10:00 AM EDT by telephone and verified that I am speaking with the correct person using two identifiers.   I discussed the limitations of evaluation and management by telemedicine and the availability of in person appointments. The patient expressed understanding and agreed to proceed.  The patient is currently at home and I am in the office.    No referring provider.    History of Present Illness: She is here for an acute visit for cold symptoms.   Her symptoms just started a few weeks.  She states she has had some postnasal drip and cough in the spring and that was somewhat similar.  No symptoms had gone away until recently.  She is experiencing cough and occasionally bringing up clear sputum, feeling mildly winded on occasion and she has had wheezing a couple of times.  She denies any fevers, significant nasal congestion, ear pain, sinus pain, sore throat and headaches  She has not tried taking anything for her symptoms.  She has a history of allergies, but since she moved here she is not had any issues with them.    Review of Systems  Constitutional: Negative for chills and fever.  HENT: Negative for congestion, ear pain, sinus pain and sore throat.        PND  Respiratory: Positive for cough (clear sputum), shortness of breath (mild winded on occasion) and wheezing (a couple of times).   Gastrointestinal: Negative for diarrhea and nausea.  Musculoskeletal: Negative for myalgias.  Neurological: Negative for dizziness and headaches.      Social History   Socioeconomic History  . Marital status: Divorced    Spouse name: Not on file  . Number of children: Not on file  . Years of education: Not on file  . Highest education level: Not on file  Occupational History  . 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 Financial resource strain: Not  on file  . Food insecurity    Worry: Not on file    Inability: Not on file  . Transportation needs    Medical: Not on file    Non-medical: Not on file  Tobacco Use  . Smoking status: Never Smoker  . Smokeless tobacco: Never Used  Substance and Sexual Activity  . Alcohol use: Yes    Comment: 4-6 beers a day  . Drug use: No  . Sexual activity: Not on file  Lifestyle  . Physical activity    Days per week: Not on file    Minutes per session: Not on file  . Stress: Not on file  Relationships  . Social Herbalist on phone: Not on file    Gets together: Not on file    Attends religious service: Not on file    Active member of club or organization: Not on file    Attends meetings of clubs or organizations: Not on file    Relationship status: Not on file  Other Topics Concern  . Not on file  Social History Narrative   No regular exercise      Assessment and Plan:  See Problem List for Assessment and Plan of chronic medical problems.   Follow Up Instructions:    I discussed the assessment and treatment plan with the patient. The patient was provided an opportunity to ask questions and all were answered. The patient agreed  with the plan and demonstrated an understanding of the instructions.   The patient was advised to call back or seek an in-person evaluation if the symptoms worsen or if the condition fails to improve as anticipated.   Pincus Sanes, MD

## 2019-09-23 ENCOUNTER — Other Ambulatory Visit: Payer: Self-pay

## 2019-09-23 ENCOUNTER — Ambulatory Visit
Admission: RE | Admit: 2019-09-23 | Discharge: 2019-09-23 | Disposition: A | Discharge status: Home / Self Care | Payer: 59 | Source: Ambulatory Visit | Attending: Obstetrics and Gynecology | Admitting: Obstetrics and Gynecology

## 2019-09-23 DIAGNOSIS — Z1231 Encounter for screening mammogram for malignant neoplasm of breast: Secondary | ICD-10-CM

## 2019-09-25 ENCOUNTER — Ambulatory Visit (INDEPENDENT_AMBULATORY_CARE_PROVIDER_SITE_OTHER): Payer: 59 | Admitting: Internal Medicine

## 2019-09-25 ENCOUNTER — Encounter: Payer: Self-pay | Admitting: Internal Medicine

## 2019-09-25 DIAGNOSIS — R05 Cough: Secondary | ICD-10-CM | POA: Diagnosis not present

## 2019-09-25 DIAGNOSIS — R059 Cough, unspecified: Secondary | ICD-10-CM | POA: Insufficient documentation

## 2019-09-25 MED ORDER — AMOXICILLIN-POT CLAVULANATE 875-125 MG PO TABS: 1.0000 | ORAL_TABLET | Freq: Two times a day (BID) | ORAL | 0 refills | Status: DC

## 2019-09-25 MED ORDER — ALBUTEROL SULFATE HFA 108 (90 BASE) MCG/ACT IN AERS: 2.0000 | INHALATION_SPRAY | Freq: Four times a day (QID) | RESPIRATORY_TRACT | 2 refills | Status: DC | PRN

## 2019-09-25 NOTE — Assessment & Plan Note (Signed)
Cough associated with occasional wheezing Has been going on for several months Unlikely viral in nature Not responsive to allergy medication Denies GERD, but I do wonder if she has any silent GERD She does have some mucus-we will try an antibiotic Advised to restart using the albuterol inhaler If no improvement she needs to go on a PPI

## 2019-09-25 NOTE — Progress Notes (Signed)
Virtual Visit via telephone telephone Note  I connected with Sandra [name] on 09/25/19 at  3:45 PM EST by telephone and verified that I am speaking with the correct person using two identifiers.   I discussed the limitations of evaluation and management by telemedicine and the availability of in person appointments. The patient expressed understanding and agreed to proceed.  The patient is currently at home and I am in the office.    No referring provider.    History of Present Illness: She is here for an acute visit for cold symptoms.   Her symptoms started several months ago.  We discussed this in June and again in October.  She is experiencing an intermittent cough.  She feels like she has mucus in her bronchial airways and needs to try to get it up.  She states this feels similar to when she had a cold a few years ago.  She does have occasional wheezing.  She denies any fevers, shortness of breath, typical cold symptoms.  She has tried taking Claritin, but that has not helped.  I did send in an albuterol inhaler to her pharmacy, but she has not tried it..  She denies any heartburn symptoms.  She denies hoarseness.  She think she is low risk for Covid, but will get tested tomorrow.  Her temp is 97.5 at home today   Review of Systems  Constitutional: Negative for fever.  HENT: Negative for congestion, ear pain, sinus pain and sore throat.   Respiratory: Positive for cough (intermittent- needs to cough up drainage in bronchial tubes) and wheezing (occ). Negative for shortness of breath.   Gastrointestinal: Negative for diarrhea and nausea.  Musculoskeletal: Negative for myalgias.  Neurological: Negative for dizziness and headaches.      Social History   Socioeconomic History  . Marital status: Divorced    Spouse name: Not on file  . Number of children: Not on file  . Years of education: Not on file  . Highest education level: Not on file  Occupational History  .  Occupation: Retail buyer & part-time dietary aide    Employer: UNEMPLOYED  Social Needs  . Financial resource strain: Not on file  . Food insecurity    Worry: Not on file    Inability: Not on file  . Transportation needs    Medical: Not on file    Non-medical: Not on file  Tobacco Use  . Smoking status: Never Smoker  . Smokeless tobacco: Never Used  Substance and Sexual Activity  . Alcohol use: Yes    Comment: 4-6 beers a day  . Drug use: No  . Sexual activity: Not on file  Lifestyle  . Physical activity    Days per week: Not on file    Minutes per session: Not on file  . Stress: Not on file  Relationships  . Social Musician on phone: Not on file    Gets together: Not on file    Attends religious service: Not on file    Active member of club or organization: Not on file    Attends meetings of clubs or organizations: Not on file    Relationship status: Not on file  Other Topics Concern  . Not on file  Social History Narrative   No regular exercise       Assessment and Plan:  See Problem List for Assessment and Plan of chronic medical problems.   Follow Up Instructions:    I  discussed the assessment and treatment plan with the patient. The patient was provided an opportunity to ask questions and all were answered. The patient agreed with the plan and demonstrated an understanding of the instructions.   The patient was advised to call back or seek an in-person evaluation if the symptoms worsen or if the condition fails to improve as anticipated.  Time spent on telephone call: 8 minutes.  Binnie Rail, MD

## 2019-09-26 ENCOUNTER — Other Ambulatory Visit: Payer: Self-pay

## 2019-09-26 DIAGNOSIS — Z20822 Contact with and (suspected) exposure to covid-19: Secondary | ICD-10-CM

## 2019-09-28 LAB — NOVEL CORONAVIRUS, NAA: SARS-CoV-2, NAA: NOT DETECTED

## 2019-10-16 ENCOUNTER — Other Ambulatory Visit: Payer: Self-pay | Admitting: Internal Medicine

## 2019-10-16 NOTE — Telephone Encounter (Signed)
Pt states she contacted pharmacy for refills but they advised it wouldn't be ready until after Dec 1st. Pt states she only has 2 pills left and will need refill sent in before the 1st/ please advise   Refills needed for ezetimibe (ZETIA) 10 MG tablet  hydrochlorothiazide (MICROZIDE) 12.5 MG capsule  rosuvastatin (CRESTOR) 20 MG tablet  Sent to [facility] on [address]

## 2019-12-26 ENCOUNTER — Telehealth: Payer: Self-pay | Admitting: Internal Medicine

## 2019-12-26 NOTE — Telephone Encounter (Signed)
Yes, she should get the vaccine

## 2019-12-26 NOTE — Telephone Encounter (Signed)
    Patient wants to know if Dr Lawerance Bach feels she should get the covid vaccine

## 2019-12-27 NOTE — Telephone Encounter (Signed)
Pt aware.

## 2019-12-30 NOTE — Progress Notes (Signed)
Subjective:    Patient ID: Sandra [name], female    DOB: April 28, 1951, 69 y.o.   MRN: [id number]  HPI The patient is here for follow up of their chronic medical problems, including  Prediabetes, hyperlipidemia, leg edema, alcohol abuse, obesity.  She is taking all of her medications as prescribed.   She is exercising a little bit, but not like she should.    The past couple of days she has been not compliant with a low sugar diet, but prior to that she was.  She has not had any alcohol since last October.  She has lost weight.  And she thinks most of that is related to not drinking.  She is trying to eat healthy.  She has no concerns.    Medications and allergies reviewed with patient and updated if appropriate.  Patient Active Problem List   Diagnosis Date Noted  . Cough 09/25/2019  . Allergic rhinitis 09/06/2019  . Alcohol abuse 03/26/2019  . Genital herpes 03/23/2018  . Prediabetes 03/25/2017  . Knee swelling 06/16/2016  . Obesity (BMI 30-39.9) 05/02/2014  . Edema 09/16/2013  . Vitamin D deficiency 03/01/2013  . Osteopenia 03/30/2010  . Hyperlipidemia 03/17/2008    Current Outpatient Medications on File Prior to Visit  Medication Sig Dispense Refill  . albuterol (VENTOLIN HFA) 108 (90 Base) MCG/ACT inhaler Inhale 2 puffs into the lungs every 6 (six) hours as needed for wheezing or shortness of breath. 6.7 g 2  . Calcium Carbonate-Vitamin D (CALCIUM 600+D) 600-400 MG-UNIT tablet Take 1 tablet by mouth 2 (two) times daily.    Marland Kitchen ezetimibe (ZETIA) 10 MG tablet TAKE 1 TABLET BY MOUTH EVERY DAY 30 tablet 5  . hydrochlorothiazide (MICROZIDE) 12.5 MG capsule TAKE 1 CAPSULE BY MOUTH EVERY DAY 30 capsule 5  . rosuvastatin (CRESTOR) 20 MG tablet TAKE 1 TABLET BY MOUTH EVERY DAY 30 tablet 5   No current facility-administered medications on file prior to visit.    Past Medical History:  Diagnosis Date  . Hyperlipidemia   . Osteopenia    ??    Past Surgical History:    Procedure Laterality Date  . CATARACT EXTRACTION Bilateral   . COLONOSCOPY     Dr Senaida Ores  . DILATION AND CURETTAGE OF UTERUS    . G0P0    . WISDOM TOOTH EXTRACTION      Social History   Socioeconomic History  . Marital status: Divorced    Spouse name: Not on file  . Number of children: Not on file  . Years of education: Not on file  . Highest education level: Not on file  Occupational History  . 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 Smoking status: Never Smoker  . Smokeless tobacco: Never Used  Substance and Sexual Activity  . Alcohol use: Yes    Comment: 4-6 beers a day  . Drug use: No  . Sexual activity: Not on file  Other Topics Concern  . Not on file  Social History Narrative   No regular exercise   Social Determinants of Health   Financial Resource Strain:   . Difficulty of Paying Living Expenses: Not on file  Food Insecurity:   . Worried About Charity fundraiser in the Last Year: Not on file  . Ran Out of Food in the Last Year: Not on file  Transportation Needs:   . Lack of Transportation (Medical): Not on file  .  Lack of Transportation (Non-Medical): Not on file  Physical Activity:   . Days of Exercise per Week: Not on file  . Minutes of Exercise per Session: Not on file  Stress:   . Feeling of Stress : Not on file  Social Connections:   . Frequency of Communication with Friends and Family: Not on file  . Frequency of Social Gatherings with Friends and Family: Not on file  . Attends Religious Services: Not on file  . Active Member of Clubs or Organizations: Not on file  . Attends Banker Meetings: Not on file  . Marital Status: Not on file    Family History  Problem Relation Age of Onset  . Stroke Father        Alsheimer's  . Diabetes Other        mat. & pat sides  . Hypertension Other        mat & pat sides  . Heart disease Mother        irreg heart rate & heart failure   . Hyperlipidemia Sister   . Glaucoma Sister   . Hyperlipidemia Brother   . Cancer Paternal Aunt        X 2 had breast cncer; 1 aunt cervical cancer  . Heart attack Maternal Grandmother        < 65  . Glaucoma Maternal Aunt   . Macular degeneration Maternal Aunt   . Hypertension Maternal Aunt   . Asthma Maternal Aunt     Review of Systems  Constitutional: Negative for chills and fever.  Respiratory: Negative for cough, shortness of breath and wheezing.   Cardiovascular: Positive for leg swelling (mild). Negative for chest pain and palpitations.  Neurological: Negative for light-headedness and headaches.  Psychiatric/Behavioral: Negative for dysphoric mood. The patient is not nervous/anxious.        Objective:   Vitals:   12/31/19 1355  BP: 114/68  Pulse: 69  Resp: 16  Temp: 97.8 F (36.6 C)  SpO2: 97%   BP Readings from Last 3 Encounters:  12/31/19 114/68  06/28/19 110/72  02/16/19 126/85   Wt Readings from Last 3 Encounters:  12/31/19 175 lb (79.4 kg)  06/28/19 189 lb (85.7 kg)  10/12/18 180 lb 1.3 oz (81.7 kg)   Body mass index is 32.01 kg/m.   Physical Exam    Constitutional: Appears well-developed and well-nourished. No distress.  HENT:  Head: Normocephalic and atraumatic.  Neck: Neck supple. No tracheal deviation present. No thyromegaly present.  No cervical lymphadenopathy Cardiovascular: Normal rate, regular rhythm and normal heart sounds.   No murmur heard. No carotid bruit .  No edema Pulmonary/Chest: Effort normal and breath sounds normal. No respiratory distress. No has no wheezes. No rales.  Skin: Skin is warm and dry. Not diaphoretic.  Psychiatric: Normal mood and affect. Behavior is normal.      Assessment & Plan:    See Problem List for Assessment and Plan of chronic medical problems.    This visit occurred during the SARS-CoV-2 public health emergency.  Safety protocols were in place, including screening questions prior to the visit,  additional usage of staff PPE, and extensive cleaning of exam room while observing appropriate contact time as indicated for disinfecting solutions.

## 2019-12-31 ENCOUNTER — Encounter: Payer: Self-pay | Admitting: Internal Medicine

## 2019-12-31 ENCOUNTER — Ambulatory Visit: Payer: 59 | Admitting: Internal Medicine

## 2019-12-31 ENCOUNTER — Other Ambulatory Visit: Payer: Self-pay

## 2019-12-31 VITALS — BP 114/68 | HR 69 | Temp 97.8°F | Resp 16 | Ht 62.0 in | Wt 175.0 lb

## 2019-12-31 DIAGNOSIS — E782 Mixed hyperlipidemia: Secondary | ICD-10-CM

## 2019-12-31 DIAGNOSIS — E669 Obesity, unspecified: Secondary | ICD-10-CM

## 2019-12-31 DIAGNOSIS — R600 Localized edema: Secondary | ICD-10-CM | POA: Diagnosis not present

## 2019-12-31 DIAGNOSIS — F1010 Alcohol abuse, uncomplicated: Secondary | ICD-10-CM | POA: Diagnosis not present

## 2019-12-31 DIAGNOSIS — R7303 Prediabetes: Secondary | ICD-10-CM

## 2019-12-31 LAB — COMPREHENSIVE METABOLIC PANEL
ALT: 15 U/L (ref 0–35)
AST: 18 U/L (ref 0–37)
Albumin: 4.2 g/dL (ref 3.5–5.2)
Alkaline Phosphatase: 69 U/L (ref 39–117)
BUN: 6 mg/dL (ref 6–23)
CO2: 31 mEq/L (ref 19–32)
Calcium: 9.8 mg/dL (ref 8.4–10.5)
Chloride: 103 mEq/L (ref 96–112)
Creatinine, Ser: 0.93 mg/dL (ref 0.40–1.20)
GFR: 72.9 mL/min (ref >60.00)
Glucose, Bld: 113 mg/dL — ABNORMAL HIGH (ref 70–99)
Potassium: 3.5 mEq/L (ref 3.5–5.1)
Sodium: 140 mEq/L (ref 135–145)
Total Bilirubin: 0.4 mg/dL (ref 0.2–1.2)
Total Protein: 7.9 g/dL (ref 6.0–8.3)

## 2019-12-31 LAB — LIPID PANEL
Cholesterol: 177 mg/dL (ref 0–200)
HDL: 51.3 mg/dL (ref >39.00)
LDL Cholesterol: 99 mg/dL (ref 0–99)
NonHDL: 125.31
Total CHOL/HDL Ratio: 3
Triglycerides: 130 mg/dL (ref 0.0–149.0)
VLDL: 26 mg/dL (ref 0.0–40.0)

## 2019-12-31 LAB — HEMOGLOBIN A1C: Hgb A1c MFr Bld: 6.2 % (ref 4.6–6.5)

## 2019-12-31 NOTE — Assessment & Plan Note (Signed)
Chronic Check a1c Low sugar / carb diet Stressed regular exercise  

## 2019-12-31 NOTE — Assessment & Plan Note (Signed)
She has stopped drinking as of October 2020 She has lost weight because she has stopped Encouraged her to continue with abstinence

## 2019-12-31 NOTE — Assessment & Plan Note (Signed)
Chronic Bilateral lower extremity edema stable and controlled Continue hydrochlorothiazide CMP

## 2019-12-31 NOTE — Patient Instructions (Addendum)
°  Blood work was ordered.   ° ° °Medications reviewed and updated.  Changes include :   none ° ° ° °Please followup in 6 months ° ° °

## 2019-12-31 NOTE — Assessment & Plan Note (Signed)
Chronic Check lipid panel, CMP Continue daily statin Regular exercise and healthy diet encouraged  

## 2019-12-31 NOTE — Assessment & Plan Note (Addendum)
Chronic She has lost weight since her last visit and I congratulated her Encouraged regular exercise, healthy diet and continuing to avoid alcohol Encouraged further weight loss Follow-up in 6 months

## 2020-02-11 ENCOUNTER — Telehealth: Payer: Self-pay

## 2020-02-11 NOTE — Telephone Encounter (Signed)
Patient calling and states that she has the 1st pneumonia injection and was supposed to come back last year for the 2nd injection but did not get to due to COVID. Would like to know if she needs to start over with the 1st injection or if she can schedule the 2nd injection at this time?

## 2020-02-11 NOTE — Telephone Encounter (Signed)
LVM letting pt know that she can get the pneumonia vaccine as long as she does not get the COVID vaccine within 2 weeks before or after the pneumonia shot. Can schedule nurse visit

## 2020-03-13 ENCOUNTER — Other Ambulatory Visit: Payer: Self-pay

## 2020-03-13 ENCOUNTER — Ambulatory Visit (INDEPENDENT_AMBULATORY_CARE_PROVIDER_SITE_OTHER): Payer: 59 | Admitting: *Deleted

## 2020-03-13 DIAGNOSIS — Z23 Encounter for immunization: Secondary | ICD-10-CM | POA: Diagnosis not present

## 2020-04-14 ENCOUNTER — Other Ambulatory Visit: Payer: Self-pay | Admitting: Internal Medicine

## 2020-07-03 ENCOUNTER — Encounter: Payer: 59 | Admitting: Internal Medicine

## 2020-07-05 NOTE — Progress Notes (Signed)
Subjective:    Patient ID: Sandra [name], female    DOB: 1951/07/11, 69 y.o.   MRN: [id number]  HPI She is here for a physical exam.   When she urinates she has a little stinging or burning at the very end of her urination.  It started three days ago.  It has not gotten worse.  She started drinking cranberry juice.    Medications and allergies reviewed with patient and updated if appropriate.  Patient Active Problem List   Diagnosis Date Noted  . Cough 09/25/2019  . Allergic rhinitis 09/06/2019  . Alcohol abuse 03/26/2019  . Genital herpes 03/23/2018  . Prediabetes 03/25/2017  . Knee swelling 06/16/2016  . Obesity (BMI 30-39.9) 05/02/2014  . Edema 09/16/2013  . Vitamin D deficiency 03/01/2013  . Osteopenia 03/30/2010  . Hyperlipidemia 03/17/2008    Current Outpatient Medications on File Prior to Visit  Medication Sig Dispense Refill  . albuterol (VENTOLIN HFA) 108 (90 Base) MCG/ACT inhaler Inhale 2 puffs into the lungs every 6 (six) hours as needed for wheezing or shortness of breath. 6.7 g 2  . Calcium Carbonate-Vitamin D (CALCIUM 600+D) 600-400 MG-UNIT tablet Take 1 tablet by mouth 2 (two) times daily.    . Calcium Citrate-Vitamin D (CITRACAL + D PO) Citracal    . ezetimibe (ZETIA) 10 MG tablet TAKE 1 TABLET BY MOUTH EVERY DAY 30 tablet 5  . hydrochlorothiazide (MICROZIDE) 12.5 MG capsule TAKE 1 CAPSULE BY MOUTH EVERY DAY 30 capsule 5  . ketorolac (ACULAR) 0.5 % ophthalmic solution Place 1 drop into the left eye 3 (three) times daily.    . rosuvastatin (CRESTOR) 20 MG tablet TAKE 1 TABLET BY MOUTH EVERY DAY 30 tablet 5  . valACYclovir (VALTREX) 500 MG tablet valacyclovir 500 mg tablet     No current facility-administered medications on file prior to visit.    Past Medical History:  Diagnosis Date  . Hyperlipidemia   . Osteopenia    ??    Past Surgical History:  Procedure Laterality Date  . CATARACT EXTRACTION Bilateral   . COLONOSCOPY     Dr Jeralene Peters  .  DILATION AND CURETTAGE OF UTERUS    . G0P0    . WISDOM TOOTH EXTRACTION      Social History   Socioeconomic History  . Marital status: Divorced    Spouse name: Not on file  . Number of children: Not on file  . Years of education: Not on file  . Highest education level: Not on file  Occupational History  . Occupation: Retail buyer & part-time dietary aide    Employer: UNEMPLOYED  Tobacco Use  . Smoking status: Never Smoker  . Smokeless tobacco: Never Used  Vaping Use  . Vaping Use: Never used  Substance and Sexual Activity  . Alcohol use: Yes    Comment: 4-6 beers a day  . Drug use: No  . Sexual activity: Not on file  Other Topics Concern  . Not on file  Social History Narrative   No regular exercise   Social Determinants of Health   Financial Resource Strain:   . Difficulty of Paying Living Expenses:   Food Insecurity:   . Worried About Programme researcher, broadcasting/film/video in the Last Year:   . Barista in the Last Year:   Transportation Needs:   . Freight forwarder (Medical):   Marland Kitchen Lack of Transportation (Non-Medical):   Physical Activity:   . Days of Exercise per Week:   .  Minutes of Exercise per Session:   Stress:   . Feeling of Stress :   Social Connections:   . Frequency of Communication with Friends and Family:   . Frequency of Social Gatherings with Friends and Family:   . Attends Religious Services:   . Active Member of Clubs or Organizations:   . Attends Banker Meetings:   Marland Kitchen Marital Status:     Family History  Problem Relation Age of Onset  . Stroke Father        Alsheimer's  . Diabetes Other        mat. & pat sides  . Hypertension Other        mat & pat sides  . Heart disease Mother        irreg heart rate & heart failure  . Hyperlipidemia Sister   . Glaucoma Sister   . Hyperlipidemia Brother   . Cancer Paternal Aunt        X 2 had breast cncer; 1 aunt cervical cancer  . Heart attack Maternal Grandmother        < 65  .  Glaucoma Maternal Aunt   . Macular degeneration Maternal Aunt   . Hypertension Maternal Aunt   . Asthma Maternal Aunt     Review of Systems  Constitutional: Negative for chills and fever.  Eyes: Negative for visual disturbance.  Respiratory: Negative for cough, shortness of breath and wheezing.   Cardiovascular: Positive for palpitations (occ, no change). Negative for chest pain and leg swelling.  Gastrointestinal: Negative for abdominal pain, blood in stool, constipation, diarrhea and nausea.       No gerd  Genitourinary: Positive for dysuria, frequency and urgency. Negative for hematuria (no discoloration).  Musculoskeletal: Negative for arthralgias and back pain.  Skin: Negative for rash.  Neurological: Negative for light-headedness and headaches.  Psychiatric/Behavioral: Negative for dysphoric mood. The patient is not nervous/anxious.        Objective:   Vitals:   07/06/20 1357  BP: 112/78  Pulse: 64  Temp: 98.1 F (36.7 C)  SpO2: 99%   Filed Weights   07/06/20 1357  Weight: 162 lb 12.8 oz (73.8 kg)   Body mass index is 29.78 kg/m.  BP Readings from Last 3 Encounters:  07/06/20 112/78  12/31/19 114/68  06/28/19 110/72    Wt Readings from Last 3 Encounters:  07/06/20 162 lb 12.8 oz (73.8 kg)  12/31/19 175 lb (79.4 kg)  06/28/19 189 lb (85.7 kg)     Physical Exam Constitutional: She appears well-developed and well-nourished. No distress.  HENT:  Head: Normocephalic and atraumatic.  Right Ear: External ear normal. Normal ear canal and TM Left Ear: External ear normal.  Normal ear canal and TM Mouth/Throat: Oropharynx is clear and moist.  Eyes: Conjunctivae and EOM are normal.  Neck: Neck supple. No tracheal deviation present. No thyromegaly present.  No carotid bruit  Cardiovascular: Normal rate, regular rhythm and normal heart sounds.   No murmur heard.  No edema. Pulmonary/Chest: Effort normal and breath sounds normal. No respiratory distress. She has  no wheezes. She has no rales.  Breast: deferred   Abdominal: Soft. She exhibits no distension. There is no tenderness.  Lymphadenopathy: She has no cervical adenopathy.  Skin: Skin is warm and dry. She is not diaphoretic.  Psychiatric: She has a normal mood and affect. Her behavior is normal.        Assessment & Plan:   Physical exam: Screening blood work  ordered Immunizations   tdap due - had a gyn - ? When - will ck with Dr Arelia Sneddon,  Had covid, discussed flu ( gets at work) Colonoscopy  Up to date  Mammogram  Up to date  Gyn  - Dr Arelia Sneddon - due - will schedule Dexa  Up to date - done by gyn Eye exams  Up to date  Exercise  Walking  Weight  Working on weight loss Substance abuse   - alcohol - abstaining - only two drinks since last year  See Problem List for Assessment and Plan of chronic medical problems.   This visit occurred during the SARS-CoV-2 public health emergency.  Safety protocols were in place, including screening questions prior to the visit, additional usage of staff PPE, and extensive cleaning of exam room while observing appropriate contact time as indicated for disinfecting solutions.

## 2020-07-05 NOTE — Patient Instructions (Addendum)
Blood work was ordered.    All other Health Maintenance issues reviewed.   All recommended immunizations and age-appropriate screenings are up-to-date or discussed.  No immunization administered today.   Medications reviewed and updated.  Changes include :     Your prescription(s) have been submitted to your pharmacy. Please take as directed and contact our office if you believe you are having problem(s) with the medication(s).  A referral was ordered for      Someone will call you to schedule an appointment.    Please followup in 6 months    Health Maintenance, Female Adopting a healthy lifestyle and getting preventive care are important in promoting health and wellness. Ask your health care provider about:  The right schedule for you to have regular tests and exams.  Things you can do on your own to prevent diseases and keep yourself healthy. What should I know about diet, weight, and exercise? Eat a healthy diet   Eat a diet that includes plenty of vegetables, fruits, low-fat dairy products, and lean protein.  Do not eat a lot of foods that are high in solid fats, added sugars, or sodium. Maintain a healthy weight Body mass index (BMI) is used to identify weight problems. It estimates body fat based on height and weight. Your health care provider can help determine your BMI and help you achieve or maintain a healthy weight. Get regular exercise Get regular exercise. This is one of the most important things you can do for your health. Most adults should:  Exercise for at least 150 minutes each week. The exercise should increase your heart rate and make you sweat (moderate-intensity exercise).  Do strengthening exercises at least twice a week. This is in addition to the moderate-intensity exercise.  Spend less time sitting. Even light physical activity can be beneficial. Watch cholesterol and blood lipids Have your blood tested for lipids and cholesterol at 67 years of  age, then have this test every 5 years. Have your cholesterol levels checked more often if:  Your lipid or cholesterol levels are high.  You are older than 67 years of age.  You are at high risk for heart disease. What should I know about cancer screening? Depending on your health history and family history, you may need to have cancer screening at various ages. This may include screening for:  Breast cancer.  Cervical cancer.  Colorectal cancer.  Skin cancer.  Lung cancer. What should I know about heart disease, diabetes, and high blood pressure? Blood pressure and heart disease  High blood pressure causes heart disease and increases the risk of stroke. This is more likely to develop in people who have high blood pressure readings, are of African descent, or are overweight.  Have your blood pressure checked: ? Every 3-5 years if you are 18-39 years of age. ? Every year if you are 40 years old or older. Diabetes Have regular diabetes screenings. This checks your fasting blood sugar level. Have the screening done:  Once every three years after age 40 if you are at a normal weight and have a low risk for diabetes.  More often and at a younger age if you are overweight or have a high risk for diabetes. What should I know about preventing infection? Hepatitis B If you have a higher risk for hepatitis B, you should be screened for this virus. Talk with your health care provider to find out if you are at risk for hepatitis B infection. Hepatitis C   Testing is recommended for:  Everyone born from 1945 through 1965.  Anyone with known risk factors for hepatitis C. Sexually transmitted infections (STIs)  Get screened for STIs, including gonorrhea and chlamydia, if: ? You are sexually active and are younger than 67 years of age. ? You are older than 67 years of age and your health care provider tells you that you are at risk for this type of infection. ? Your sexual activity has  changed since you were last screened, and you are at increased risk for chlamydia or gonorrhea. Ask your health care provider if you are at risk.  Ask your health care provider about whether you are at high risk for HIV. Your health care provider may recommend a prescription medicine to help prevent HIV infection. If you choose to take medicine to prevent HIV, you should first get tested for HIV. You should then be tested every 3 months for as long as you are taking the medicine. Pregnancy  If you are about to stop having your period (premenopausal) and you may become pregnant, seek counseling before you get pregnant.  Take 400 to 800 micrograms (mcg) of folic acid every day if you become pregnant.  Ask for birth control (contraception) if you want to prevent pregnancy. Osteoporosis and menopause Osteoporosis is a disease in which the bones lose minerals and strength with aging. This can result in bone fractures. If you are 65 years old or older, or if you are at risk for osteoporosis and fractures, ask your health care provider if you should:  Be screened for bone loss.  Take a calcium or vitamin D supplement to lower your risk of fractures.  Be given hormone replacement therapy (HRT) to treat symptoms of menopause. Follow these instructions at home: Lifestyle  Do not use any products that contain nicotine or tobacco, such as cigarettes, e-cigarettes, and chewing tobacco. If you need help quitting, ask your health care provider.  Do not use street drugs.  Do not share needles.  Ask your health care provider for help if you need support or information about quitting drugs. Alcohol use  Do not drink alcohol if: ? Your health care provider tells you not to drink. ? You are pregnant, may be pregnant, or are planning to become pregnant.  If you drink alcohol: ? Limit how much you use to 0-1 drink a day. ? Limit intake if you are breastfeeding.  Be aware of how much alcohol is in  your drink. In the [place] one drink equals one 12 oz bottle of beer (355 mL), one 5 oz glass of wine (148 mL), or one 1 oz glass of hard liquor (44 mL). General instructions  Schedule regular health, dental, and eye exams.  Stay current with your vaccines.  Tell your health care provider if: ? You often feel depressed. ? You have ever been abused or do not feel safe at home. Summary  Adopting a healthy lifestyle and getting preventive care are important in promoting health and wellness.  Follow your health care provider's instructions about healthy diet, exercising, and getting tested or screened for diseases.  Follow your health care provider's instructions on monitoring your cholesterol and blood pressure. This information is not intended to replace advice given to you by your health care provider. Make sure you discuss any questions you have with your health care provider. Document Revised: 10/31/2018 Document Reviewed: 10/31/2018 Elsevier Patient Education  2020 Elsevier Inc.  

## 2020-07-06 ENCOUNTER — Ambulatory Visit (INDEPENDENT_AMBULATORY_CARE_PROVIDER_SITE_OTHER): Payer: 59 | Admitting: Internal Medicine

## 2020-07-06 ENCOUNTER — Encounter: Payer: Self-pay | Admitting: Internal Medicine

## 2020-07-06 ENCOUNTER — Other Ambulatory Visit: Payer: Self-pay

## 2020-07-06 VITALS — BP 112/78 | HR 64 | Temp 98.1°F | Ht 62.0 in | Wt 162.8 lb

## 2020-07-06 DIAGNOSIS — Z0000 Encounter for general adult medical examination without abnormal findings: Secondary | ICD-10-CM

## 2020-07-06 DIAGNOSIS — F1010 Alcohol abuse, uncomplicated: Secondary | ICD-10-CM

## 2020-07-06 DIAGNOSIS — R300 Dysuria: Secondary | ICD-10-CM

## 2020-07-06 DIAGNOSIS — E782 Mixed hyperlipidemia: Secondary | ICD-10-CM

## 2020-07-06 DIAGNOSIS — R600 Localized edema: Secondary | ICD-10-CM

## 2020-07-06 DIAGNOSIS — R7303 Prediabetes: Secondary | ICD-10-CM

## 2020-07-06 DIAGNOSIS — M8580 Other specified disorders of bone density and structure, unspecified site: Secondary | ICD-10-CM

## 2020-07-06 NOTE — Assessment & Plan Note (Signed)
Chronic Controlled, stable Continue current dose of medication hctz 12.5 mg

## 2020-07-06 NOTE — Assessment & Plan Note (Signed)
Chronic Check a1c Low sugar / carb diet Stressed regular exercise  

## 2020-07-06 NOTE — Assessment & Plan Note (Signed)
Chronic Managed by Dr Arelia Sneddon

## 2020-07-06 NOTE — Assessment & Plan Note (Signed)
H/o of abuse Has been abstaining

## 2020-07-06 NOTE — Assessment & Plan Note (Signed)
Acute ? uti Check UA, Ucx

## 2020-07-06 NOTE — Assessment & Plan Note (Signed)
Chronic Check lipid panel  Continue daily statin, zeita Regular exercise and healthy diet encouraged

## 2020-07-08 LAB — URINALYSIS, ROUTINE W REFLEX MICROSCOPIC
Bacteria, UA: NONE SEEN /HPF
Bilirubin Urine: NEGATIVE
Glucose, UA: NEGATIVE
Hgb urine dipstick: NEGATIVE
Hyaline Cast: NONE SEEN /LPF
Ketones, ur: NEGATIVE
Nitrite: NEGATIVE
Protein, ur: NEGATIVE
RBC / HPF: NONE SEEN /HPF (ref 0–2)
Specific Gravity, Urine: 1.006 (ref 1.001–1.03)
Squamous Epithelial / HPF: NONE SEEN /HPF (ref <=5)
pH: 6 (ref 5.0–8.0)

## 2020-07-08 LAB — CBC WITH DIFFERENTIAL/PLATELET
Absolute Monocytes: 854 cells/uL (ref 200–950)
Basophils Absolute: 77 cells/uL (ref 0–200)
Basophils Relative: 0.8 %
Eosinophils Absolute: 192 cells/uL (ref 15–500)
Eosinophils Relative: 2 %
HCT: 39.6 % (ref 35.0–45.0)
Hemoglobin: 13 g/dL (ref 11.7–15.5)
Lymphs Abs: 3216 cells/uL (ref 850–3900)
MCH: 29.8 pg (ref 27.0–33.0)
MCHC: 32.8 g/dL (ref 32.0–36.0)
MCV: 90.8 fL (ref 80.0–100.0)
MPV: 11.9 fL (ref 7.5–12.5)
Monocytes Relative: 8.9 %
Neutro Abs: 5261 cells/uL (ref 1500–7800)
Neutrophils Relative %: 54.8 %
Platelets: 265 10*3/uL (ref 140–400)
RBC: 4.36 10*6/uL (ref 3.80–5.10)
RDW: 13.5 % (ref 11.0–15.0)
Total Lymphocyte: 33.5 %
WBC: 9.6 10*3/uL (ref 3.8–10.8)

## 2020-07-08 LAB — HEMOGLOBIN A1C
Hgb A1c MFr Bld: 5.9 % of total Hgb — ABNORMAL HIGH (ref <5.7)
Mean Plasma Glucose: 123 (calc)
eAG (mmol/L): 6.8 (calc)

## 2020-07-08 LAB — COMPREHENSIVE METABOLIC PANEL
AG Ratio: 1.4 (calc) (ref 1.0–2.5)
ALT: 17 U/L (ref 6–29)
AST: 20 U/L (ref 10–35)
Albumin: 4.4 g/dL (ref 3.6–5.1)
Alkaline phosphatase (APISO): 69 U/L (ref 37–153)
BUN: 9 mg/dL (ref 7–25)
CO2: 29 mmol/L (ref 20–32)
Calcium: 9.9 mg/dL (ref 8.6–10.4)
Chloride: 102 mmol/L (ref 98–110)
Creat: 0.89 mg/dL (ref 0.50–0.99)
Globulin: 3.1 g/dL (calc) (ref 1.9–3.7)
Glucose, Bld: 80 mg/dL (ref 65–99)
Potassium: 3.8 mmol/L (ref 3.5–5.3)
Sodium: 141 mmol/L (ref 135–146)
Total Bilirubin: 0.5 mg/dL (ref 0.2–1.2)
Total Protein: 7.5 g/dL (ref 6.1–8.1)

## 2020-07-08 LAB — LIPID PANEL
Cholesterol: 182 mg/dL (ref <200)
HDL: 63 mg/dL (ref >=50)
LDL Cholesterol (Calc): 104 mg/dL (calc) — ABNORMAL HIGH
Non-HDL Cholesterol (Calc): 119 mg/dL (calc) (ref <130)
Total CHOL/HDL Ratio: 2.9 (calc) (ref <5.0)
Triglycerides: 68 mg/dL (ref <150)

## 2020-07-08 LAB — URINE CULTURE

## 2020-07-08 LAB — TSH: TSH: 1.72 mIU/L (ref 0.40–4.50)

## 2020-07-09 ENCOUNTER — Other Ambulatory Visit: Payer: Self-pay | Admitting: Internal Medicine

## 2020-07-09 MED ORDER — CEPHALEXIN 500 MG PO CAPS: 500.0000 mg | ORAL_CAPSULE | Freq: Two times a day (BID) | ORAL | 0 refills | Status: AC

## 2020-07-24 ENCOUNTER — Other Ambulatory Visit: Payer: Self-pay | Admitting: Obstetrics and Gynecology

## 2020-07-24 DIAGNOSIS — Z1231 Encounter for screening mammogram for malignant neoplasm of breast: Secondary | ICD-10-CM

## 2020-09-23 ENCOUNTER — Ambulatory Visit: Payer: 59

## 2020-10-10 ENCOUNTER — Other Ambulatory Visit: Payer: Self-pay | Admitting: Internal Medicine

## 2020-11-02 ENCOUNTER — Ambulatory Visit
Admission: RE | Admit: 2020-11-02 | Discharge: 2020-11-02 | Disposition: A | Discharge status: Home / Self Care | Payer: 59 | Source: Ambulatory Visit | Attending: Obstetrics and Gynecology | Admitting: Obstetrics and Gynecology

## 2020-11-02 ENCOUNTER — Other Ambulatory Visit: Payer: Self-pay

## 2020-11-02 DIAGNOSIS — Z1231 Encounter for screening mammogram for malignant neoplasm of breast: Secondary | ICD-10-CM

## 2021-04-13 ENCOUNTER — Other Ambulatory Visit: Payer: Self-pay | Admitting: Internal Medicine

## 2021-04-21 ENCOUNTER — Telehealth: Payer: Self-pay | Admitting: Internal Medicine

## 2021-04-21 ENCOUNTER — Other Ambulatory Visit: Payer: Self-pay

## 2021-04-21 MED ORDER — ALBUTEROL SULFATE HFA 108 (90 BASE) MCG/ACT IN AERS: 2.0000 | INHALATION_SPRAY | Freq: Four times a day (QID) | RESPIRATORY_TRACT | 2 refills | Status: DC | PRN

## 2021-04-21 NOTE — Telephone Encounter (Signed)
1.Medication Requested: albuterol (VENTOLIN HFA) 108 (90 Base) MCG/ACT inhaler    2. Pharmacy (Name, Street, [employer]): [id number] - [place], [place] - [address]  3. On Med List: yes   4. Last Visit with PCP: 07-06-20  5. Next visit date with PCP:  07-13-21   Agent: Please be advised that RX refills may take up to 3 business days. We ask that you follow-up with your pharmacy.

## 2021-04-21 NOTE — Telephone Encounter (Signed)
Faxed today

## 2021-05-13 ENCOUNTER — Other Ambulatory Visit: Payer: Self-pay | Admitting: Internal Medicine

## 2021-07-12 NOTE — Patient Instructions (Addendum)
Blood work was ordered.     Medications changes include :  none   Your prescription(s) have been submitted to your pharmacy. Please take as directed and contact our office if you believe you are having problem(s) with the medication(s).   Please followup in 1 year   Health Maintenance, Female Adopting a healthy lifestyle and getting preventive care are important in promoting health and wellness. Ask your health care provider about: The right schedule for you to have regular tests and exams. Things you can do on your own to prevent diseases and keep yourself healthy. What should I know about diet, weight, and exercise? Eat a healthy diet  Eat a diet that includes plenty of vegetables, fruits, low-fat dairy products, and lean protein. Do not eat a lot of foods that are high in solid fats, added sugars, or sodium.  Maintain a healthy weight Body mass index (BMI) is used to identify weight problems. It estimates body fat based on height and weight. Your health care provider can help determineyour BMI and help you achieve or maintain a healthy weight. Get regular exercise Get regular exercise. This is one of the most important things you can do for your health. Most adults should: Exercise for at least 150 minutes each week. The exercise should increase your heart rate and make you sweat (moderate-intensity exercise). Do strengthening exercises at least twice a week. This is in addition to the moderate-intensity exercise. Spend less time sitting. Even light physical activity can be beneficial. Watch cholesterol and blood lipids Have your blood tested for lipids and cholesterol at 68 years of age, then havethis test every 5 years. Have your cholesterol levels checked more often if: Your lipid or cholesterol levels are high. You are older than 68 years of age. You are at high risk for heart disease. What should I know about cancer screening? Depending on your health history and family  history, you may need to have cancer screening at various ages. This may include screening for: Breast cancer. Cervical cancer. Colorectal cancer. Skin cancer. Lung cancer. What should I know about heart disease, diabetes, and high blood pressure? Blood pressure and heart disease High blood pressure causes heart disease and increases the risk of stroke. This is more likely to develop in people who have high blood pressure readings, are of African descent, or are overweight. Have your blood pressure checked: Every 3-5 years if you are 18-39 years of age. Every year if you are 40 years old or older. Diabetes Have regular diabetes screenings. This checks your fasting blood sugar level. Have the screening done: Once every three years after age 40 if you are at a normal weight and have a low risk for diabetes. More often and at a younger age if you are overweight or have a high risk for diabetes. What should I know about preventing infection? Hepatitis B If you have a higher risk for hepatitis B, you should be screened for this virus. Talk with your health care provider to find out if you are at risk forhepatitis B infection. Hepatitis C Testing is recommended for: Everyone born from 1945 through 1965. Anyone with known risk factors for hepatitis C. Sexually transmitted infections (STIs) Get screened for STIs, including gonorrhea and chlamydia, if: You are sexually active and are younger than 68 years of age. You are older than 68 years of age and your health care provider tells you that you are at risk for this type of infection. Your sexual activity has   changed since you were last screened, and you are at increased risk for chlamydia or gonorrhea. Ask your health care provider if you are at risk. Ask your health care provider about whether you are at high risk for HIV. Your health care provider may recommend a prescription medicine to help prevent HIV infection. If you choose to take  medicine to prevent HIV, you should first get tested for HIV. You should then be tested every 3 months for as long as you are taking the medicine. Pregnancy If you are about to stop having your period (premenopausal) and you may become pregnant, seek counseling before you get pregnant. Take 400 to 800 micrograms (mcg) of folic acid every day if you become pregnant. Ask for birth control (contraception) if you want to prevent pregnancy. Osteoporosis and menopause Osteoporosis is a disease in which the bones lose minerals and strength with aging. This can result in bone fractures. If you are 65 years old or older, or if you are at risk for osteoporosis and fractures, ask your health care provider if you should: Be screened for bone loss. Take a calcium or vitamin D supplement to lower your risk of fractures. Be given hormone replacement therapy (HRT) to treat symptoms of menopause. Follow these instructions at home: Lifestyle Do not use any products that contain nicotine or tobacco, such as cigarettes, e-cigarettes, and chewing tobacco. If you need help quitting, ask your health care provider. Do not use street drugs. Do not share needles. Ask your health care provider for help if you need support or information about quitting drugs. Alcohol use Do not drink alcohol if: Your health care provider tells you not to drink. You are pregnant, may be pregnant, or are planning to become pregnant. If you drink alcohol: Limit how much you use to 0-1 drink a day. Limit intake if you are breastfeeding. Be aware of how much alcohol is in your drink. In the [place] one drink equals one 12 oz bottle of beer (355 mL), one 5 oz glass of wine (148 mL), or one 1 oz glass of hard liquor (44 mL). General instructions Schedule regular health, dental, and eye exams. Stay current with your vaccines. Tell your health care provider if: You often feel depressed. You have ever been abused or do not feel safe at  home. Summary Adopting a healthy lifestyle and getting preventive care are important in promoting health and wellness. Follow your health care provider's instructions about healthy diet, exercising, and getting tested or screened for diseases. Follow your health care provider's instructions on monitoring your cholesterol and blood pressure. This information is not intended to replace advice given to you by your health care provider. Make sure you discuss any questions you have with your healthcare provider. Document Revised: 10/31/2018 Document Reviewed: 10/31/2018 Elsevier Patient Education  2022 Elsevier Inc.  

## 2021-07-12 NOTE — Progress Notes (Signed)
Subjective:    Patient ID: Sandra [name], female    DOB: Mar 29, 1951, 70 y.o.   MRN: [id number]   This visit occurred during the SARS-CoV-2 public health emergency.  Safety protocols were in place, including screening questions prior to the visit, additional usage of staff PPE, and extensive cleaning of exam room while observing appropriate contact time as indicated for disinfecting solutions.    HPI She is here for a physical exam.   She gained back the weight she lost.  She has not been exercising - walking like she was.    Medications and allergies reviewed with patient and updated if appropriate.  Patient Active Problem List   Diagnosis Date Noted   Cough 09/25/2019   Allergic rhinitis 09/06/2019   Alcohol abuse 03/26/2019   Genital herpes 03/23/2018   Prediabetes 03/25/2017   Knee swelling 06/16/2016   Obesity (BMI 30-39.9) 05/02/2014   Edema 09/16/2013   Vitamin D deficiency 03/01/2013   Osteopenia 03/30/2010   Hyperlipidemia 03/17/2008    Current Outpatient Medications on File Prior to Visit  Medication Sig Dispense Refill   albuterol (VENTOLIN HFA) 108 (90 Base) MCG/ACT inhaler Inhale 2 puffs into the lungs every 6 (six) hours as needed for wheezing or shortness of breath. 6.7 g 2   Calcium Carbonate-Vitamin D 600-400 MG-UNIT tablet Take 1 tablet by mouth 2 (two) times daily.     Calcium Citrate-Vitamin D (CITRACAL + D PO) Citracal     ezetimibe (ZETIA) 10 MG tablet TAKE 1 TABLET BY MOUTH EVERY DAY 30 tablet 2   hydrochlorothiazide (MICROZIDE) 12.5 MG capsule TAKE 1 CAPSULE BY MOUTH EVERY DAY 30 capsule 2   ketorolac (ACULAR) 0.5 % ophthalmic solution Place 1 drop into the left eye 3 (three) times daily.     rosuvastatin (CRESTOR) 20 MG tablet TAKE 1 TABLET BY MOUTH EVERY DAY 30 tablet 2   No current facility-administered medications on file prior to visit.    Past Medical History:  Diagnosis Date   Hyperlipidemia    Osteopenia    ??    Past Surgical  History:  Procedure Laterality Date   CATARACT EXTRACTION Bilateral    COLONOSCOPY     Dr Jeralene Peters   DILATION AND CURETTAGE OF UTERUS     [id number]     WISDOM TOOTH EXTRACTION      Social History   Socioeconomic History   Marital status: Divorced    Spouse name: Not on file   Number of children: Not on file   Years of education: Not on file   Highest education level: Not on file  Occupational History   Occupation: Retail buyer & part-time dietary aide    Employer: UNEMPLOYED  Tobacco Use   Smoking status: Never   Smokeless tobacco: Never  Vaping Use   Vaping Use: Never used  Substance and Sexual Activity   Alcohol use: Yes    Comment: 4-6 beers a day   Drug use: No   Sexual activity: Not on file  Other Topics Concern   Not on file  Social History Narrative   No regular exercise   Social Determinants of Health   Financial Resource Strain: Not on file  Food Insecurity: Not on file  Transportation Needs: Not on file  Physical Activity: Not on file  Stress: Not on file  Social Connections: Not on file    Family History  Problem Relation Age of Onset   Stroke Father  Alsheimer's   Diabetes Other        mat. & pat sides   Hypertension Other        mat & pat sides   Heart disease Mother        irreg heart rate & heart failure   Hyperlipidemia Sister    Glaucoma Sister    Hyperlipidemia Brother    Cancer Paternal Aunt        X 2 had breast cncer; 1 aunt cervical cancer   Heart attack Maternal Grandmother        < 65   Glaucoma Maternal Aunt    Macular degeneration Maternal Aunt    Hypertension Maternal Aunt    Asthma Maternal Aunt     Review of Systems  Constitutional:  Negative for chills and fever.  Eyes:  Negative for visual disturbance.  Respiratory:  Negative for cough, shortness of breath and wheezing.   Cardiovascular:  Positive for leg swelling (occ). Negative for chest pain and palpitations.  Gastrointestinal:  Negative for abdominal pain,  blood in stool, constipation, diarrhea and nausea.       No gerd  Genitourinary:  Negative for dysuria and hematuria.  Musculoskeletal:  Positive for arthralgias (right knee occ). Negative for back pain.  Skin:  Negative for color change and rash.  Neurological:  Negative for light-headedness and headaches.  Psychiatric/Behavioral:  Negative for dysphoric mood. The patient is not nervous/anxious.       Objective:   Vitals:   07/13/21 1335  BP: 118/74  Pulse: 72  Temp: 97.9 F (36.6 C)  SpO2: 98%   Filed Weights   07/13/21 1335  Weight: 185 lb (83.9 kg)   Body mass index is 33.84 kg/m.  BP Readings from Last 3 Encounters:  07/13/21 118/74  07/06/20 112/78  12/31/19 114/68    Wt Readings from Last 3 Encounters:  07/13/21 185 lb (83.9 kg)  07/06/20 162 lb 12.8 oz (73.8 kg)  12/31/19 175 lb (79.4 kg)   Depression screen [facility] 2/9 07/13/2021 07/06/2020 06/28/2019  Decreased Interest 0 0 0  Down, Depressed, Hopeless 0 0 0  PHQ - 2 Score 0 0 0  Altered sleeping 0 - -  Tired, decreased energy 0 - -  Change in appetite 1 - -  Feeling bad or failure about yourself  0 - -  Trouble concentrating 0 - -  Moving slowly or fidgety/restless 0 - -  Suicidal thoughts 0 - -  PHQ-9 Score 1 - -  Difficult doing work/chores Not difficult at all - -    GAD 7 : Generalized Anxiety Score 07/13/2021  Nervous, Anxious, on Edge 0  Control/stop worrying 0  Worry too much - different things 0  Trouble relaxing 0  Restless 0  Easily annoyed or irritable 0  Afraid - awful might happen 0  Total GAD 7 Score 0      Physical Exam Constitutional: She appears well-developed and well-nourished. No distress.  HENT:  Head: Normocephalic and atraumatic.  Right Ear: External ear normal. Normal ear canal and TM Left Ear: External ear normal.  Normal ear canal and TM Mouth/Throat: Oropharynx is clear and moist.  Eyes: Conjunctivae and EOM are normal.  Neck: Neck supple. No tracheal deviation  present. No thyromegaly present.  No carotid bruit  Cardiovascular: Normal rate, regular rhythm and normal heart sounds.   No murmur heard.  No edema. Pulmonary/Chest: Effort normal and breath sounds normal. No respiratory distress. She has no wheezes. She has no  rales.  Breast: deferred   Abdominal: Soft. She exhibits no distension. There is no tenderness.  Lymphadenopathy: She has no cervical adenopathy.  Skin: Skin is warm and dry. She is not diaphoretic.  Psychiatric: She has a normal mood and affect. Her behavior is normal.     Lab Results  Component Value Date   WBC 9.6 07/06/2020   HGB 13.0 07/06/2020   HCT 39.6 07/06/2020   PLT 265 07/06/2020   GLUCOSE 80 07/06/2020   CHOL 182 07/06/2020   TRIG 68 07/06/2020   HDL 63 07/06/2020   LDLDIRECT 120.0 06/28/2019   LDLCALC 104 (H) 07/06/2020   ALT 17 07/06/2020   AST 20 07/06/2020   NA 141 07/06/2020   K 3.8 07/06/2020   CL 102 07/06/2020   CREATININE 0.89 07/06/2020   BUN 9 07/06/2020   CO2 29 07/06/2020   TSH 1.72 07/06/2020   INR 1.08 06/05/2016   HGBA1C 5.9 (H) 07/06/2020         Assessment & Plan:   Physical exam: Screening blood work  ordered Exercise  some - stressed regular exercise Weight  discussed weight loss Substance abuse  none    Screened for depression using the PHQ 9 scale.  No evidence of depression.   Screened for anxiety using GAD7 Scale.  No evidence of anxiety.   Reviewed recommended immunizations.  Tdap given today.   Health Maintenance  Topic Date Due   COVID-19 Vaccine (1) Never done   TETANUS/TDAP  Never done   INFLUENZA VACCINE  06/21/2021   DEXA SCAN  07/13/2022 (Originally 08/17/2020)   MAMMOGRAM  11/02/2022   COLONOSCOPY (Pts 45-8yrs Insurance coverage will need to be confirmed)  10/03/2026   Hepatitis C Screening  Completed   PNA vac Low Risk Adult  Completed   Zoster Vaccines- Shingrix  Completed   HPV VACCINES  Aged Out          See Problem List for  Assessment and Plan of chronic medical problems.

## 2021-07-13 ENCOUNTER — Ambulatory Visit (INDEPENDENT_AMBULATORY_CARE_PROVIDER_SITE_OTHER): Payer: 59 | Admitting: Internal Medicine

## 2021-07-13 ENCOUNTER — Encounter: Payer: Self-pay | Admitting: Internal Medicine

## 2021-07-13 ENCOUNTER — Other Ambulatory Visit: Payer: Self-pay

## 2021-07-13 VITALS — BP 118/74 | HR 72 | Temp 97.9°F | Ht 62.0 in | Wt 185.0 lb

## 2021-07-13 DIAGNOSIS — Z23 Encounter for immunization: Secondary | ICD-10-CM

## 2021-07-13 DIAGNOSIS — E782 Mixed hyperlipidemia: Secondary | ICD-10-CM | POA: Diagnosis not present

## 2021-07-13 DIAGNOSIS — M8580 Other specified disorders of bone density and structure, unspecified site: Secondary | ICD-10-CM | POA: Diagnosis not present

## 2021-07-13 DIAGNOSIS — F1010 Alcohol abuse, uncomplicated: Secondary | ICD-10-CM

## 2021-07-13 DIAGNOSIS — Z0000 Encounter for general adult medical examination without abnormal findings: Secondary | ICD-10-CM

## 2021-07-13 DIAGNOSIS — A6004 Herpesviral vulvovaginitis: Secondary | ICD-10-CM

## 2021-07-13 DIAGNOSIS — R7303 Prediabetes: Secondary | ICD-10-CM

## 2021-07-13 DIAGNOSIS — E669 Obesity, unspecified: Secondary | ICD-10-CM

## 2021-07-13 DIAGNOSIS — R600 Localized edema: Secondary | ICD-10-CM

## 2021-07-13 DIAGNOSIS — Z1331 Encounter for screening for depression: Secondary | ICD-10-CM

## 2021-07-13 LAB — CBC WITH DIFFERENTIAL/PLATELET
Basophils Absolute: 0.1 10*3/uL (ref 0.0–0.1)
Basophils Relative: 0.8 % (ref 0.0–3.0)
Eosinophils Absolute: 0.1 10*3/uL (ref 0.0–0.7)
Eosinophils Relative: 1.2 % (ref 0.0–5.0)
HCT: 39.2 % (ref 36.0–46.0)
Hemoglobin: 12.9 g/dL (ref 12.0–15.0)
Lymphocytes Relative: 27.3 % (ref 12.0–46.0)
Lymphs Abs: 2.3 10*3/uL (ref 0.7–4.0)
MCHC: 32.9 g/dL (ref 30.0–36.0)
MCV: 94.8 fl (ref 78.0–100.0)
Monocytes Absolute: 0.8 10*3/uL (ref 0.1–1.0)
Monocytes Relative: 8.7 % (ref 3.0–12.0)
Neutro Abs: 5.3 10*3/uL (ref 1.4–7.7)
Neutrophils Relative %: 62 % (ref 43.0–77.0)
Platelets: 259 10*3/uL (ref 150.0–400.0)
RBC: 4.14 Mil/uL (ref 3.87–5.11)
RDW: 14.6 % (ref 11.5–15.5)
WBC: 8.6 10*3/uL (ref 4.0–10.5)

## 2021-07-13 LAB — LIPID PANEL
Cholesterol: 200 mg/dL (ref 0–200)
HDL: 81.2 mg/dL (ref >39.00)
LDL Cholesterol: 95 mg/dL (ref 0–99)
NonHDL: 118.45
Total CHOL/HDL Ratio: 2
Triglycerides: 116 mg/dL (ref 0.0–149.0)
VLDL: 23.2 mg/dL (ref 0.0–40.0)

## 2021-07-13 LAB — COMPREHENSIVE METABOLIC PANEL
ALT: 29 U/L (ref 0–35)
AST: 28 U/L (ref 0–37)
Albumin: 4.1 g/dL (ref 3.5–5.2)
Alkaline Phosphatase: 76 U/L (ref 39–117)
BUN: 11 mg/dL (ref 6–23)
CO2: 27 mEq/L (ref 19–32)
Calcium: 9.5 mg/dL (ref 8.4–10.5)
Chloride: 102 mEq/L (ref 96–112)
Creatinine, Ser: 0.97 mg/dL (ref 0.40–1.20)
GFR: 60.29 mL/min (ref >60.00)
Glucose, Bld: 79 mg/dL (ref 70–99)
Potassium: 3.6 mEq/L (ref 3.5–5.1)
Sodium: 139 mEq/L (ref 135–145)
Total Bilirubin: 0.5 mg/dL (ref 0.2–1.2)
Total Protein: 7.8 g/dL (ref 6.0–8.3)

## 2021-07-13 LAB — TSH: TSH: 2.66 u[IU]/mL (ref 0.35–5.50)

## 2021-07-13 LAB — HEMOGLOBIN A1C: Hgb A1c MFr Bld: 6.2 % (ref 4.6–6.5)

## 2021-07-13 MED ORDER — VALACYCLOVIR HCL 500 MG PO TABS: 500.0000 mg | ORAL_TABLET | Freq: Two times a day (BID) | ORAL | 2 refills | Status: AC

## 2021-07-13 NOTE — Assessment & Plan Note (Addendum)
Acute on chronic Has flare now - mild tingling sensation Start valtrex 500 mg BID x 3 days - can take as needed for outbreaks

## 2021-07-13 NOTE — Assessment & Plan Note (Signed)
Chronic Controlled, stable Continue hctz 12.5 mg qd

## 2021-07-13 NOTE — Addendum Note (Signed)
Addended by: Madelon Lips on: 07/13/2021 02:18 PM   Modules accepted: Orders

## 2021-07-13 NOTE — Assessment & Plan Note (Signed)
Chronic Doing better Drinks one a day and not every day

## 2021-07-13 NOTE — Assessment & Plan Note (Signed)
Chronic Stressed regular exercise, decreased portions and weight loss

## 2021-07-13 NOTE — Assessment & Plan Note (Signed)
Chronic Check a1c Low sugar / carb diet Stressed regular exercise  

## 2021-07-13 NOTE — Assessment & Plan Note (Signed)
Chronic Check lipid panel  Continue zetia 10 mg daily Regular exercise and healthy diet encouraged  

## 2021-07-13 NOTE — Assessment & Plan Note (Signed)
Chronic Stressed regular exercise Had dexa done at gyn's office

## 2021-07-13 NOTE — Addendum Note (Signed)
Addended by: Karma Ganja on: 07/13/2021 04:23 PM   Modules accepted: Orders

## 2021-08-15 ENCOUNTER — Other Ambulatory Visit: Payer: Self-pay | Admitting: Internal Medicine

## 2021-10-04 ENCOUNTER — Other Ambulatory Visit: Payer: Self-pay | Admitting: Obstetrics and Gynecology

## 2021-10-04 DIAGNOSIS — Z1231 Encounter for screening mammogram for malignant neoplasm of breast: Secondary | ICD-10-CM

## 2021-11-05 ENCOUNTER — Ambulatory Visit: Payer: 59

## 2021-11-08 DIAGNOSIS — I10 Essential (primary) hypertension: Secondary | ICD-10-CM | POA: Insufficient documentation

## 2021-11-08 DIAGNOSIS — J45909 Unspecified asthma, uncomplicated: Secondary | ICD-10-CM | POA: Insufficient documentation

## 2021-11-11 ENCOUNTER — Ambulatory Visit
Admission: RE | Admit: 2021-11-11 | Discharge: 2021-11-11 | Disposition: A | Discharge status: Home / Self Care | Payer: 59 | Source: Ambulatory Visit | Attending: Obstetrics and Gynecology | Admitting: Obstetrics and Gynecology

## 2021-11-11 DIAGNOSIS — Z1231 Encounter for screening mammogram for malignant neoplasm of breast: Secondary | ICD-10-CM

## 2021-11-16 ENCOUNTER — Other Ambulatory Visit: Payer: Self-pay | Admitting: Internal Medicine

## 2021-12-06 ENCOUNTER — Other Ambulatory Visit: Payer: Self-pay

## 2021-12-06 ENCOUNTER — Ambulatory Visit: Payer: 59 | Admitting: Internal Medicine

## 2021-12-06 ENCOUNTER — Encounter: Payer: Self-pay | Admitting: Internal Medicine

## 2021-12-06 DIAGNOSIS — H66003 Acute suppurative otitis media without spontaneous rupture of ear drum, bilateral: Secondary | ICD-10-CM

## 2021-12-06 MED ORDER — AMOXICILLIN-POT CLAVULANATE 875-125 MG PO TABS: 1.0000 | ORAL_TABLET | Freq: Two times a day (BID) | ORAL | 0 refills | Status: AC

## 2021-12-06 NOTE — Patient Instructions (Signed)
You likely have an ear infection. We have sent in an antibiotic called augmentin to take 1 pill twice a day for 1 week.

## 2021-12-06 NOTE — Progress Notes (Signed)
° °  Subjective:   Patient ID: Sandra [name], female    DOB: 07-11-1951, 71 y.o.   MRN: [id number]  HPI The patient is a 71 YO female coming in for ear pain, sinus congestion and muffled hearing started Saturday.   Review of Systems  Constitutional: Negative.   HENT:  Positive for congestion, ear pain and hearing loss.   Eyes: Negative.   Respiratory:  Negative for cough, chest tightness and shortness of breath.   Cardiovascular:  Negative for chest pain, palpitations and leg swelling.  Gastrointestinal:  Negative for abdominal distention, abdominal pain, constipation, diarrhea, nausea and vomiting.  Musculoskeletal: Negative.   Skin: Negative.   Neurological: Negative.   Psychiatric/Behavioral: Negative.     Objective:  Physical Exam Constitutional:      Appearance: She is well-developed.  HENT:     Head: Normocephalic and atraumatic.     Ears:     Comments: Left TM with cloudy to white fluid and bulging, Right TM similar with cloudy fluid and bulging Cardiovascular:     Rate and Rhythm: Normal rate and regular rhythm.  Pulmonary:     Effort: Pulmonary effort is normal. No respiratory distress.     Breath sounds: Normal breath sounds. No wheezing or rales.  Abdominal:     General: Bowel sounds are normal. There is no distension.     Palpations: Abdomen is soft.     Tenderness: There is no abdominal tenderness. There is no rebound.  Musculoskeletal:     Cervical back: Normal range of motion.  Skin:    General: Skin is warm and dry.  Neurological:     Mental Status: She is alert and oriented to person, place, and time.     Coordination: Coordination normal.    Vitals:   12/06/21 1438  BP: 124/80  Pulse: (!) 111  Resp: 18  SpO2: 97%  Weight: 186 lb 3.2 oz (84.5 kg)  Height: 5\' 2"  (1.575 m)    This visit occurred during the SARS-CoV-2 public health emergency.  Safety protocols were in place, including screening questions prior to the visit, additional usage of staff  PPE, and extensive cleaning of exam room while observing appropriate contact time as indicated for disinfecting solutions.   Assessment & Plan:

## 2021-12-07 DIAGNOSIS — H6690 Otitis media, unspecified, unspecified ear: Secondary | ICD-10-CM | POA: Insufficient documentation

## 2021-12-07 NOTE — Assessment & Plan Note (Signed)
Rx augmentin 1 week treatment for likely bilateral ear infection. Given hearing change will treat early.

## 2021-12-08 ENCOUNTER — Ambulatory Visit: Payer: 59

## 2021-12-13 ENCOUNTER — Encounter: Payer: Self-pay | Admitting: Internal Medicine

## 2021-12-13 NOTE — Progress Notes (Signed)
Subjective:    Patient ID: Sandra [name], female    DOB: [birth year], 71 y.o.   MRN: [id number]  This visit occurred during the SARS-CoV-2 public health emergency.  Safety protocols were in place, including screening questions prior to the visit, additional usage of staff PPE, and extensive cleaning of exam room while observing appropriate contact time as indicated for disinfecting solutions.    HPI The patient is here for an acute visit - follow up from ear infection.   She was seen here 1/16 and diagnosed with b/l ear infection.  She was prescribed Augmentin bid x 1 week.  The antibiotic did help a little.  Some of her symptoms are better.    She has decreased hearing in both ears.  She has a little shooting pain in her ears intermittently.  She can feel fluid in her ears at night.  They do pop a little at times.   Medications and allergies reviewed with patient and updated if appropriate.  Patient Active Problem List   Diagnosis Date Noted   ETD (Eustachian tube dysfunction), bilateral 12/14/2021   Otitis media 12/07/2021   Cough 09/25/2019   Allergic rhinitis 09/06/2019   Alcohol abuse 03/26/2019   Genital herpes 03/23/2018   Prediabetes 03/25/2017   Knee swelling 06/16/2016   Obesity (BMI 30-39.9) 05/02/2014   Edema 09/16/2013   Vitamin D deficiency 03/01/2013   Osteopenia 03/30/2010   Hyperlipidemia 03/17/2008    Current Outpatient Medications on File Prior to Visit  Medication Sig Dispense Refill   albuterol (VENTOLIN HFA) 108 (90 Base) MCG/ACT inhaler Inhale 2 puffs into the lungs every 6 (six) hours as needed for wheezing or shortness of breath. 6.7 g 2   Calcium Carbonate-Vitamin D 600-400 MG-UNIT tablet Take 1 tablet by mouth 2 (two) times daily.     Calcium Citrate-Vitamin D (CITRACAL + D PO) Citracal     ezetimibe (ZETIA) 10 MG tablet TAKE 1 TABLET BY MOUTH EVERY DAY 30 tablet 2   hydrochlorothiazide (MICROZIDE) 12.5 MG capsule TAKE 1 CAPSULE BY MOUTH EVERY  DAY 30 capsule 2   ketorolac (ACULAR) 0.5 % ophthalmic solution Place 1 drop into the left eye 3 (three) times daily.     rosuvastatin (CRESTOR) 20 MG tablet TAKE 1 TABLET BY MOUTH EVERY DAY 30 tablet 2   valACYclovir (VALTREX) 500 MG tablet Take 1 tablet (500 mg total) by mouth 2 (two) times daily. Take for 3 days for outbreak 30 tablet 2   No current facility-administered medications on file prior to visit.    Past Medical History:  Diagnosis Date   Hyperlipidemia    Osteopenia    ??    Past Surgical History:  Procedure Laterality Date   CATARACT EXTRACTION Bilateral    COLONOSCOPY     Dr Jeralene Peters   DILATION AND CURETTAGE OF UTERUS     [id number]     WISDOM TOOTH EXTRACTION      Social History   Socioeconomic History   Marital status: Divorced    Spouse name: Not on file   Number of children: Not on file   Years of education: Not on file   Highest education level: Not on file  Occupational History   Occupation: Retail buyer & part-time dietary aide    Employer: UNEMPLOYED  Tobacco Use   Smoking status: Never   Smokeless tobacco: Never  Vaping Use   Vaping Use: Never used  Substance and Sexual Activity   Alcohol use: Yes  Comment: 4-6 beers a day   Drug use: No   Sexual activity: Not on file  Other Topics Concern   Not on file  Social History Narrative   No regular exercise   Social Determinants of Health   Financial Resource Strain: Not on file  Food Insecurity: Not on file  Transportation Needs: Not on file  Physical Activity: Not on file  Stress: Not on file  Social Connections: Not on file    Family History  Problem Relation Age of Onset   Stroke Father        Alsheimer's   Diabetes Other        mat. & pat sides   Hypertension Other        mat & pat sides   Heart disease Mother        irreg heart rate & heart failure   Hyperlipidemia Sister    Glaucoma Sister    Hyperlipidemia Brother    Cancer Paternal Aunt        X 2 had breast cncer; 1  aunt cervical cancer   Heart attack Maternal Grandmother        < 65   Glaucoma Maternal Aunt    Macular degeneration Maternal Aunt    Hypertension Maternal Aunt    Asthma Maternal Aunt     Review of Systems  Constitutional:  Negative for fever.  HENT:  Positive for ear pain and hearing loss. Negative for congestion, sinus pain and sore throat.   Respiratory:  Negative for cough, shortness of breath and wheezing.       Objective:   Vitals:   12/14/21 1503  BP: 108/70  Pulse: 80  Temp: 97.9 F (36.6 C)  SpO2: 96%   BP Readings from Last 3 Encounters:  12/14/21 108/70  12/06/21 124/80  07/13/21 118/74   Wt Readings from Last 3 Encounters:  12/14/21 186 lb (84.4 kg)  12/06/21 186 lb 3.2 oz (84.5 kg)  07/13/21 185 lb (83.9 kg)   Body mass index is 34.02 kg/m.   Physical Exam    GENERAL APPEARANCE: Appears stated age, well appearing, NAD EYES: conjunctiva clear, no icterus HENT: bilateral ear canals normal, bilateral tympanic membranes dull with fluid behind them - minimal erythema, oropharynx with no erythema or exudates, trachea midline, no cervical or supraclavicular lymphadenopathy LUNGS: Unlabored breathing, good air entry bilaterally, clear to auscultation without wheeze or crackles CARDIOVASCULAR: Normal S1,S2 , no edema SKIN: Warm, dry      Assessment & Plan:    See Problem List for Assessment and Plan of chronic medical problems.

## 2021-12-14 ENCOUNTER — Other Ambulatory Visit: Payer: Self-pay

## 2021-12-14 ENCOUNTER — Ambulatory Visit: Payer: 59 | Admitting: Internal Medicine

## 2021-12-14 DIAGNOSIS — H6983 Other specified disorders of Eustachian tube, bilateral: Secondary | ICD-10-CM

## 2021-12-14 DIAGNOSIS — H6991 Unspecified Eustachian tube disorder, right ear: Secondary | ICD-10-CM | POA: Insufficient documentation

## 2021-12-14 DIAGNOSIS — H6993 Unspecified Eustachian tube disorder, bilateral: Secondary | ICD-10-CM | POA: Insufficient documentation

## 2021-12-14 MED ORDER — FLUTICASONE PROPIONATE 50 MCG/ACT NA SUSP: 2.0000 | Freq: Every day | NASAL | 6 refills | Status: AC

## 2021-12-14 MED ORDER — PREDNISONE 50 MG PO TABS: 50.0000 mg | ORAL_TABLET | Freq: Every day | ORAL | 0 refills | Status: DC

## 2021-12-14 NOTE — Patient Instructions (Signed)
Medications changes include :   flonase daily.  Prednisone 50 mg daily x 5 days.  Your prescription(s) have been submitted to your pharmacy. Please take as directed and contact our office if you believe you are having problem(s) with the medication(s).     Eustachian Tube Dysfunction Eustachian tube dysfunction refers to a condition in which a blockage develops in the narrow passage that connects the middle ear to the back of the nose (eustachian tube). The eustachian tube regulates air pressure in the middle ear by letting air move between the ear and nose. It also helps to drain fluid from the middle ear space. Eustachian tube dysfunction can affect one or both ears. When the eustachian tube does not function properly, air pressure, fluid, or both can build up in the middle ear. What are the causes? This condition occurs when the eustachian tube becomes blocked or cannot open normally. Common causes of this condition include: Ear infections. Colds and other infections that affect the nose, mouth, and throat (upper respiratory tract). Allergies. Irritation from cigarette smoke. Irritation from stomach acid coming up into the esophagus (gastroesophageal reflux). The esophagus is the part of the body that moves food from the mouth to the stomach. Sudden changes in air pressure, such as from descending in an airplane or scuba diving. Abnormal growths in the nose or throat, such as: Growths that line the nose (nasal polyps). Abnormal growth of cells (tumors). Enlarged tissue at the back of the throat (adenoids). What increases the risk? You are more likely to develop this condition if: You smoke. You are overweight. You are a child who has: Certain birth defects of the mouth, such as cleft palate. Large tonsils or adenoids. What are the signs or symptoms? Common symptoms of this condition include: A feeling of fullness in the ear. Ear pain. Clicking or popping noises in the  ear. Ringing in the ear (tinnitus). Hearing loss. Loss of balance. Dizziness. Symptoms may get worse when the air pressure around you changes, such as when you travel to an area of high elevation, fly on an airplane, or go scuba diving. How is this diagnosed? This condition may be diagnosed based on: Your symptoms. A physical exam of your ears, nose, and throat. Tests, such as those that measure: The movement of your eardrum. Your hearing (audiometry). How is this treated? Treatment depends on the cause and severity of your condition. In mild cases, you may relieve your symptoms by moving air into your ears. This is called "popping the ears." In more severe cases, or if you have symptoms of fluid in your ears, treatment may include: Medicines to relieve congestion (decongestants). Medicines that treat allergies (antihistamines). Nasal sprays or ear drops that contain medicines that reduce swelling (steroids). A procedure to drain the fluid in your eardrum. In this procedure, a small tube may be placed in the eardrum to: Drain the fluid. Restore the air in the middle ear space. A procedure to insert a balloon device through the nose to inflate the opening of the eustachian tube (balloon dilation). Follow these instructions at home: Lifestyle Do not do any of the following until your health care provider approves: Travel to high altitudes. Fly in airplanes. Work in a Estate agent or room. Scuba dive. Do not use any products that contain nicotine or tobacco. These products include cigarettes, chewing tobacco, and vaping devices, such as e-cigarettes. If you need help quitting, ask your health care provider. Keep your ears dry. Wear fitted  earplugs during showering and bathing. Dry your ears completely after. General instructions Take over-the-counter and prescription medicines only as told by your health care provider. Use techniques to help pop your ears as recommended by your  health care provider. These may include: Chewing gum. Yawning. Frequent, forceful swallowing. Closing your mouth, holding your nose closed, and gently blowing as if you are trying to blow air out of your nose. Keep all follow-up visits. This is important. Contact a health care provider if: Your symptoms do not go away after treatment. Your symptoms come back after treatment. You are unable to pop your ears. You have: A fever. Pain in your ear. Pain in your head or neck. Fluid draining from your ear. Your hearing suddenly changes. You become very dizzy. You lose your balance. Get help right away if: You have a sudden, severe increase in any of your symptoms. Summary Eustachian tube dysfunction refers to a condition in which a blockage develops in the eustachian tube. It can be caused by ear infections, allergies, inhaled irritants, or abnormal growths in the nose or throat. Symptoms may include ear pain or fullness, hearing loss, or ringing in the ears. Mild cases are treated with techniques to unblock the ears, such as yawning or chewing gum. More severe cases are treated with medicines or procedures. This information is not intended to replace advice given to you by your health care provider. Make sure you discuss any questions you have with your health care provider. Document Revised: 01/18/2021 Document Reviewed: 01/18/2021 Elsevier Patient Education  2022 [employer].

## 2021-12-14 NOTE — Assessment & Plan Note (Addendum)
Acute Result of infection - the infection is better after Augmentin - no evidence of current infection Discussed etiology Start prednisone 50 mg daily with breakfast x 5 days flonase daily Try gently popping ears Call if no improvement in 5-6 days - will need to see ENT

## 2021-12-22 NOTE — Progress Notes (Signed)
Subjective:    Patient ID: Sandra [name], female    DOB: 1951/08/06, 71 y.o.   MRN: [id number]  This visit occurred during the SARS-CoV-2 public health emergency.  Safety protocols were in place, including screening questions prior to the visit, additional usage of staff PPE, and extensive cleaning of exam room while observing appropriate contact time as indicated for disinfecting solutions.    HPI The patient is here for an acute visit.  12/06/21 - b/l ear infection - treated with augmentin 12/14/21 - ETD - no evidence of infection - prednisone  Her symptoms have improved.  She can hear now, but her hearing is still decreased.  Her ears still pop, crack and it feels like the clog up at times.  When she swallows she hears popping.    She uses the flonase once a day.     Medications and allergies reviewed with patient and updated if appropriate.  Patient Active Problem List   Diagnosis Date Noted   ETD (Eustachian tube dysfunction), bilateral 12/14/2021   Otitis media 12/07/2021   Cough 09/25/2019   Allergic rhinitis 09/06/2019   Alcohol abuse 03/26/2019   Genital herpes 03/23/2018   Prediabetes 03/25/2017   Knee swelling 06/16/2016   Obesity (BMI 30-39.9) 05/02/2014   Edema 09/16/2013   Vitamin D deficiency 03/01/2013   Osteopenia 03/30/2010   Hyperlipidemia 03/17/2008    Current Outpatient Medications on File Prior to Visit  Medication Sig Dispense Refill   albuterol (VENTOLIN HFA) 108 (90 Base) MCG/ACT inhaler Inhale 2 puffs into the lungs every 6 (six) hours as needed for wheezing or shortness of breath. 6.7 g 2   Calcium Carbonate-Vitamin D 600-400 MG-UNIT tablet Take 1 tablet by mouth 2 (two) times daily.     Calcium Citrate-Vitamin D (CITRACAL + D PO) Citracal     ezetimibe (ZETIA) 10 MG tablet TAKE 1 TABLET BY MOUTH EVERY DAY 30 tablet 2   fluticasone (FLONASE) 50 MCG/ACT nasal spray Place 2 sprays into both nostrils daily. 16 g 6   hydrochlorothiazide  (MICROZIDE) 12.5 MG capsule TAKE 1 CAPSULE BY MOUTH EVERY DAY 30 capsule 2   ketorolac (ACULAR) 0.5 % ophthalmic solution Place 1 drop into the left eye 3 (three) times daily.     rosuvastatin (CRESTOR) 20 MG tablet TAKE 1 TABLET BY MOUTH EVERY DAY 30 tablet 2   valACYclovir (VALTREX) 500 MG tablet Take 1 tablet (500 mg total) by mouth 2 (two) times daily. Take for 3 days for outbreak 30 tablet 2   No current facility-administered medications on file prior to visit.    Past Medical History:  Diagnosis Date   Hyperlipidemia    Osteopenia    ??    Past Surgical History:  Procedure Laterality Date   CATARACT EXTRACTION Bilateral    COLONOSCOPY     Dr Jeralene Peters   DILATION AND CURETTAGE OF UTERUS     G0P0     WISDOM TOOTH EXTRACTION      Social History   Socioeconomic History   Marital status: Divorced    Spouse name: Not on file   Number of children: Not on file   Years of education: Not on file   Highest education level: Not on file  Occupational History   Occupation: Retail buyer & part-time dietary aide    Employer: UNEMPLOYED  Tobacco Use   Smoking status: Never   Smokeless tobacco: Never  Vaping Use   Vaping Use: Never used  Substance and Sexual Activity  Alcohol use: Yes    Comment: 4-6 beers a day   Drug use: No   Sexual activity: Not on file  Other Topics Concern   Not on file  Social History Narrative   No regular exercise   Social Determinants of Health   Financial Resource Strain: Not on file  Food Insecurity: Not on file  Transportation Needs: Not on file  Physical Activity: Not on file  Stress: Not on file  Social Connections: Not on file    Family History  Problem Relation Age of Onset   Stroke Father        Alsheimer's   Diabetes Other        mat. & pat sides   Hypertension Other        mat & pat sides   Heart disease Mother        irreg heart rate & heart failure   Hyperlipidemia Sister    Glaucoma Sister    Hyperlipidemia Brother     Cancer Paternal Aunt        X 2 had breast cncer; 1 aunt cervical cancer   Heart attack Maternal Grandmother        < 65   Glaucoma Maternal Aunt    Macular degeneration Maternal Aunt    Hypertension Maternal Aunt    Asthma Maternal Aunt     Review of Systems  Constitutional:  Negative for fever.  HENT:  Positive for ear pain (Occasional sharp pain in both ears) and hearing loss. Negative for congestion, ear discharge, sinus pain and sore throat.   Neurological:  Negative for dizziness, light-headedness and headaches.      Objective:   Vitals:   12/23/21 1011  BP: 112/68  Pulse: 77  Temp: 98.3 F (36.8 C)  SpO2: 98%   BP Readings from Last 3 Encounters:  12/23/21 112/68  12/14/21 108/70  12/06/21 124/80   Wt Readings from Last 3 Encounters:  12/23/21 184 lb (83.5 kg)  12/14/21 186 lb (84.4 kg)  12/06/21 186 lb 3.2 oz (84.5 kg)   Body mass index is 33.65 kg/m.   Physical Exam Constitutional:      General: She is not in acute distress.    Appearance: Normal appearance. She is not ill-appearing.  HENT:     Head: Normocephalic and atraumatic.     Right Ear: Tympanic membrane, ear canal and external ear normal. There is no impacted cerumen.     Left Ear: Tympanic membrane, ear canal and external ear normal. There is no impacted cerumen.  Musculoskeletal:     Cervical back: Neck supple. No tenderness.  Lymphadenopathy:     Cervical: No cervical adenopathy.  Skin:    General: Skin is warm and dry.  Neurological:     Mental Status: She is alert.           Assessment & Plan:    See Problem List for Assessment and Plan of chronic medical problems.

## 2021-12-23 ENCOUNTER — Ambulatory Visit: Payer: 59 | Admitting: Internal Medicine

## 2021-12-23 ENCOUNTER — Other Ambulatory Visit: Payer: Self-pay

## 2021-12-23 ENCOUNTER — Encounter: Payer: Self-pay | Admitting: Internal Medicine

## 2021-12-23 VITALS — BP 112/68 | HR 77 | Temp 98.3°F | Ht 62.0 in | Wt 184.0 lb

## 2021-12-23 DIAGNOSIS — H6983 Other specified disorders of Eustachian tube, bilateral: Secondary | ICD-10-CM

## 2021-12-23 MED ORDER — LEVOCETIRIZINE DIHYDROCHLORIDE 5 MG PO TABS: 5.0000 mg | ORAL_TABLET | Freq: Every evening | ORAL | 0 refills | Status: AC

## 2021-12-23 NOTE — Assessment & Plan Note (Addendum)
Subacute She initially had otitis media that was treated with Augmentin She was seen after that and she had eustachian tube dysfunction Improved with prednisone 50 mg daily x5 days and Flonase once daily She still is having some decreased hearing and a lot of popping/cracking and clogged sensation in the ears Discussed that symptoms are still consistent with eustachian tube dysfunction Can increase Flonase to twice daily-she does use Vaseline at night and will continue that Start Sudafed daily-encouraged to monitor blood pressure Start Xyzal 5 mg nightly-prescription sent to pharmacy Referral sent for ENT in case this does not improve

## 2021-12-23 NOTE — Patient Instructions (Addendum)
Medications changes include :   start sudafed daily and continue the flonase. Try xyzal 5 mg at night.     Try the combination of those three for 2 weeks.      A referral was ordered for ENT.     Someone from their office will call you to schedule an appointment.     Eustachian Tube Dysfunction Eustachian tube dysfunction refers to a condition in which a blockage develops in the narrow passage that connects the middle ear to the back of the nose (eustachian tube). The eustachian tube regulates air pressure in the middle ear by letting air move between the ear and nose. It also helps to drain fluid from the middle ear space. Eustachian tube dysfunction can affect one or both ears. When the eustachian tube does not function properly, air pressure, fluid, or both can build up in the middle ear. What are the causes? This condition occurs when the eustachian tube becomes blocked or cannot open normally. Common causes of this condition include: Ear infections. Colds and other infections that affect the nose, mouth, and throat (upper respiratory tract). Allergies. Irritation from cigarette smoke. Irritation from stomach acid coming up into the esophagus (gastroesophageal reflux). The esophagus is the part of the body that moves food from the mouth to the stomach. Sudden changes in air pressure, such as from descending in an airplane or scuba diving. Abnormal growths in the nose or throat, such as: Growths that line the nose (nasal polyps). Abnormal growth of cells (tumors). Enlarged tissue at the back of the throat (adenoids). What increases the risk? You are more likely to develop this condition if: You smoke. You are overweight. You are a child who has: Certain birth defects of the mouth, such as cleft palate. Large tonsils or adenoids. What are the signs or symptoms? Common symptoms of this condition include: A feeling of fullness in the ear. Ear pain. Clicking or popping  noises in the ear. Ringing in the ear (tinnitus). Hearing loss. Loss of balance. Dizziness. Symptoms may get worse when the air pressure around you changes, such as when you travel to an area of high elevation, fly on an airplane, or go scuba diving. How is this diagnosed? This condition may be diagnosed based on: Your symptoms. A physical exam of your ears, nose, and throat. Tests, such as those that measure: The movement of your eardrum. Your hearing (audiometry). How is this treated? Treatment depends on the cause and severity of your condition. In mild cases, you may relieve your symptoms by moving air into your ears. This is called "popping the ears." In more severe cases, or if you have symptoms of fluid in your ears, treatment may include: Medicines to relieve congestion (decongestants). Medicines that treat allergies (antihistamines). Nasal sprays or ear drops that contain medicines that reduce swelling (steroids). A procedure to drain the fluid in your eardrum. In this procedure, a small tube may be placed in the eardrum to: Drain the fluid. Restore the air in the middle ear space. A procedure to insert a balloon device through the nose to inflate the opening of the eustachian tube (balloon dilation). Follow these instructions at home: Lifestyle Do not do any of the following until your health care provider approves: Travel to high altitudes. Fly in airplanes. Work in a Estate agent or room. Scuba dive. Do not use any products that contain nicotine or tobacco. These products include cigarettes, chewing tobacco, and vaping devices, such as  e-cigarettes. If you need help quitting, ask your health care provider. Keep your ears dry. Wear fitted earplugs during showering and bathing. Dry your ears completely after. General instructions Take over-the-counter and prescription medicines only as told by your health care provider. Use techniques to help pop your ears as  recommended by your health care provider. These may include: Chewing gum. Yawning. Frequent, forceful swallowing. Closing your mouth, holding your nose closed, and gently blowing as if you are trying to blow air out of your nose. Keep all follow-up visits. This is important. Contact a health care provider if: Your symptoms do not go away after treatment. Your symptoms come back after treatment. You are unable to pop your ears. You have: A fever. Pain in your ear. Pain in your head or neck. Fluid draining from your ear. Your hearing suddenly changes. You become very dizzy. You lose your balance. Get help right away if: You have a sudden, severe increase in any of your symptoms. Summary Eustachian tube dysfunction refers to a condition in which a blockage develops in the eustachian tube. It can be caused by ear infections, allergies, inhaled irritants, or abnormal growths in the nose or throat. Symptoms may include ear pain or fullness, hearing loss, or ringing in the ears. Mild cases are treated with techniques to unblock the ears, such as yawning or chewing gum. More severe cases are treated with medicines or procedures. This information is not intended to replace advice given to you by your health care provider. Make sure you discuss any questions you have with your health care provider. Document Revised: 01/18/2021 Document Reviewed: 01/18/2021 Elsevier Patient Education  2022 [employer].

## 2022-02-24 ENCOUNTER — Other Ambulatory Visit: Payer: Self-pay | Admitting: Internal Medicine

## 2022-05-28 ENCOUNTER — Other Ambulatory Visit: Payer: Self-pay | Admitting: Internal Medicine

## 2022-07-17 ENCOUNTER — Encounter: Payer: Self-pay | Admitting: Internal Medicine

## 2022-07-17 NOTE — Patient Instructions (Addendum)
Blood work was ordered.     Medications changes include :   none     Return in about 1 year (around 07/19/2023) for Physical Exam.   Health Maintenance, Female Adopting a healthy lifestyle and getting preventive care are important in promoting health and wellness. Ask your health care provider about: The right schedule for you to have regular tests and exams. Things you can do on your own to prevent diseases and keep yourself healthy. What should I know about diet, weight, and exercise? Eat a healthy diet  Eat a diet that includes plenty of vegetables, fruits, low-fat dairy products, and lean protein. Do not eat a lot of foods that are high in solid fats, added sugars, or sodium. Maintain a healthy weight Body mass index (BMI) is used to identify weight problems. It estimates body fat based on height and weight. Your health care provider can help determine your BMI and help you achieve or maintain a healthy weight. Get regular exercise Get regular exercise. This is one of the most important things you can do for your health. Most adults should: Exercise for at least 150 minutes each week. The exercise should increase your heart rate and make you sweat (moderate-intensity exercise). Do strengthening exercises at least twice a week. This is in addition to the moderate-intensity exercise. Spend less time sitting. Even light physical activity can be beneficial. Watch cholesterol and blood lipids Have your blood tested for lipids and cholesterol at 69 years of age, then have this test every 5 years. Have your cholesterol levels checked more often if: Your lipid or cholesterol levels are high. You are older than 69 years of age. You are at high risk for heart disease. What should I know about cancer screening? Depending on your health history and family history, you may need to have cancer screening at various ages. This may include screening for: Breast cancer. Cervical  cancer. Colorectal cancer. Skin cancer. Lung cancer. What should I know about heart disease, diabetes, and high blood pressure? Blood pressure and heart disease High blood pressure causes heart disease and increases the risk of stroke. This is more likely to develop in people who have high blood pressure readings or are overweight. Have your blood pressure checked: Every 3-5 years if you are 37-76 years of age. Every year if you are 63 years old or older. Diabetes Have regular diabetes screenings. This checks your fasting blood sugar level. Have the screening done: Once every three years after age 64 if you are at a normal weight and have a low risk for diabetes. More often and at a younger age if you are overweight or have a high risk for diabetes. What should I know about preventing infection? Hepatitis B If you have a higher risk for hepatitis B, you should be screened for this virus. Talk with your health care provider to find out if you are at risk for hepatitis B infection. Hepatitis C Testing is recommended for: Everyone born from [birth year] through 1965. Anyone with known risk factors for hepatitis C. Sexually transmitted infections (STIs) Get screened for STIs, including gonorrhea and chlamydia, if: You are sexually active and are younger than 69 years of age. You are older than 69 years of age and your health care provider tells you that you are at risk for this type of infection. Your sexual activity has changed since you were last screened, and you are at increased risk for chlamydia or gonorrhea. Ask your health  care provider if you are at risk. Ask your health care provider about whether you are at high risk for HIV. Your health care provider may recommend a prescription medicine to help prevent HIV infection. If you choose to take medicine to prevent HIV, you should first get tested for HIV. You should then be tested every 3 months for as long as you are taking the  medicine. Pregnancy If you are about to stop having your period (premenopausal) and you may become pregnant, seek counseling before you get pregnant. Take 400 to 800 micrograms (mcg) of folic acid every day if you become pregnant. Ask for birth control (contraception) if you want to prevent pregnancy. Osteoporosis and menopause Osteoporosis is a disease in which the bones lose minerals and strength with aging. This can result in bone fractures. If you are 74 years old or older, or if you are at risk for osteoporosis and fractures, ask your health care provider if you should: Be screened for bone loss. Take a calcium or vitamin D supplement to lower your risk of fractures. Be given hormone replacement therapy (HRT) to treat symptoms of menopause. Follow these instructions at home: Alcohol use Do not drink alcohol if: Your health care provider tells you not to drink. You are pregnant, may be pregnant, or are planning to become pregnant. If you drink alcohol: Limit how much you have to: 0-1 drink a day. Know how much alcohol is in your drink. In the [place] one drink equals one 12 oz bottle of beer (355 mL), one 5 oz glass of wine (148 mL), or one 1 oz glass of hard liquor (44 mL). Lifestyle Do not use any products that contain nicotine or tobacco. These products include cigarettes, chewing tobacco, and vaping devices, such as e-cigarettes. If you need help quitting, ask your health care provider. Do not use street drugs. Do not share needles. Ask your health care provider for help if you need support or information about quitting drugs. General instructions Schedule regular health, dental, and eye exams. Stay current with your vaccines. Tell your health care provider if: You often feel depressed. You have ever been abused or do not feel safe at home. Summary Adopting a healthy lifestyle and getting preventive care are important in promoting health and wellness. Follow your health care  provider's instructions about healthy diet, exercising, and getting tested or screened for diseases. Follow your health care provider's instructions on monitoring your cholesterol and blood pressure. This information is not intended to replace advice given to you by your health care provider. Make sure you discuss any questions you have with your health care provider. Document Revised: 03/29/2021 Document Reviewed: 03/29/2021 Elsevier Patient Education  [employer].

## 2022-07-17 NOTE — Progress Notes (Unsigned)
Subjective:    Patient ID: Sandra [name], female    DOB: 1951/10/11, 71 y.o.   MRN: [id number]      HPI Sandra [name] is here for a Physical exam.   No concerns.  She knows she needs to work on her weight.   Medications and allergies reviewed with patient and updated if appropriate.  Current Outpatient Medications on File Prior to Visit  Medication Sig Dispense Refill   albuterol (VENTOLIN HFA) 108 (90 Base) MCG/ACT inhaler Inhale 2 puffs into the lungs every 6 (six) hours as needed for wheezing or shortness of breath. 6.7 g 2   Calcium Carbonate-Vitamin D 600-400 MG-UNIT tablet Take 1 tablet by mouth 2 (two) times daily.     Calcium Citrate-Vitamin D (CITRACAL + D PO) Citracal     ezetimibe (ZETIA) 10 MG tablet TAKE 1 TABLET BY MOUTH EVERY DAY 30 tablet 2   fluticasone (FLONASE) 50 MCG/ACT nasal spray Place 2 sprays into both nostrils daily. 16 g 6   hydrochlorothiazide (MICROZIDE) 12.5 MG capsule TAKE 1 CAPSULE BY MOUTH EVERY DAY 30 capsule 2   ketorolac (ACULAR) 0.5 % ophthalmic solution Place 1 drop into the left eye 3 (three) times daily.     levocetirizine (XYZAL) 5 MG tablet Take 1 tablet (5 mg total) by mouth every evening. 30 tablet 0   rosuvastatin (CRESTOR) 20 MG tablet TAKE 1 TABLET BY MOUTH EVERY DAY 30 tablet 2   triamcinolone ointment (KENALOG) 0.1 % Apply topically 2 (two) times daily.     valACYclovir (VALTREX) 1000 MG tablet Take by mouth.     valACYclovir (VALTREX) 500 MG tablet Take 1 tablet (500 mg total) by mouth 2 (two) times daily. Take for 3 days for outbreak 30 tablet 2   No current facility-administered medications on file prior to visit.    Review of Systems  Constitutional:  Negative for chills and fever.  Eyes:  Negative for visual disturbance.  Respiratory:  Negative for cough, shortness of breath and wheezing.   Cardiovascular:  Positive for leg swelling (mild at times). Negative for chest pain and palpitations.  Gastrointestinal:  Negative for  abdominal pain, blood in stool, constipation, diarrhea and nausea.       Gerd at times  Genitourinary:  Negative for dysuria.  Musculoskeletal:  Negative for arthralgias and back pain.  Skin:  Positive for rash (eczema).  Neurological:  Negative for light-headedness and headaches.  Psychiatric/Behavioral:  Negative for dysphoric mood. The patient is not nervous/anxious.        Objective:   Vitals:   07/18/22 1410  BP: 118/80  Pulse: 74  Temp: 97.8 F (36.6 C)  SpO2: 96%   Filed Weights   07/18/22 1410  Weight: 193 lb 2 oz (87.6 kg)   Body mass index is 35.32 kg/m.  BP Readings from Last 3 Encounters:  07/18/22 118/80  12/23/21 112/68  12/14/21 108/70    Wt Readings from Last 3 Encounters:  07/18/22 193 lb 2 oz (87.6 kg)  12/23/21 184 lb (83.5 kg)  12/14/21 186 lb (84.4 kg)       Physical Exam Constitutional: She appears well-developed and well-nourished. No distress.  HENT:  Head: Normocephalic and atraumatic.  Right Ear: External ear normal. Normal ear canal and TM Left Ear: External ear normal.  Normal ear canal and TM Mouth/Throat: Oropharynx is clear and moist.  Eyes: Conjunctivae normal.  Neck: Neck supple. No tracheal deviation present. No thyromegaly present.  No carotid bruit  Cardiovascular:  Normal rate, regular rhythm and normal heart sounds.   No murmur heard.  No edema. Pulmonary/Chest: Effort normal and breath sounds normal. No respiratory distress. She has no wheezes. She has no rales.  Breast: deferred   Abdominal: Soft. She exhibits no distension. There is no tenderness.  Lymphadenopathy: She has no cervical adenopathy.  Skin: Skin is warm and dry. She is not diaphoretic.  Psychiatric: She has a normal mood and affect. Her behavior is normal.     Lab Results  Component Value Date   WBC 8.6 07/13/2021   HGB 12.9 07/13/2021   HCT 39.2 07/13/2021   PLT 259.0 07/13/2021   GLUCOSE 79 07/13/2021   CHOL 200 07/13/2021   TRIG 116.0  07/13/2021   HDL 81.20 07/13/2021   LDLDIRECT 120.0 06/28/2019   LDLCALC 95 07/13/2021   ALT 29 07/13/2021   AST 28 07/13/2021   NA 139 07/13/2021   K 3.6 07/13/2021   CL 102 07/13/2021   CREATININE 0.97 07/13/2021   BUN 11 07/13/2021   CO2 27 07/13/2021   TSH 2.66 07/13/2021   INR 1.08 06/05/2016   HGBA1C 6.2 07/13/2021         Assessment & Plan:   Physical exam: Screening blood work  ordered Exercise  none right now Weight  knows she needs to lose weight - encouraged wieght loss-encouraged regular exercise.  Encouraged decrease portions and coming in diet high in lean protein, vegetables and fiber-low in carbs and sugars. Substance abuse  none   Reviewed recommended immunizations.   Health Maintenance  Topic Date Due   INFLUENZA VACCINE  06/21/2022   COVID-19 Vaccine (4 - Pfizer series) 08/03/2022 (Originally 10/31/2020)   DEXA SCAN  07/19/2023 (Originally 08/17/2020)   MAMMOGRAM  11/12/2023   COLONOSCOPY (Pts 45-33yrs Insurance coverage will need to be confirmed)  10/03/2026   TETANUS/TDAP  07/14/2031   Pneumonia Vaccine 41+ Years old  Completed   Hepatitis C Screening  Completed   Zoster Vaccines- Shingrix  Completed   HPV VACCINES  Aged Out          See Problem List for Assessment and Plan of chronic medical problems.

## 2022-07-18 ENCOUNTER — Ambulatory Visit (INDEPENDENT_AMBULATORY_CARE_PROVIDER_SITE_OTHER): Payer: 59 | Admitting: Internal Medicine

## 2022-07-18 VITALS — BP 118/80 | HR 74 | Temp 97.8°F | Wt 193.1 lb

## 2022-07-18 DIAGNOSIS — M8580 Other specified disorders of bone density and structure, unspecified site: Secondary | ICD-10-CM | POA: Diagnosis not present

## 2022-07-18 DIAGNOSIS — Z0000 Encounter for general adult medical examination without abnormal findings: Secondary | ICD-10-CM

## 2022-07-18 DIAGNOSIS — F1011 Alcohol abuse, in remission: Secondary | ICD-10-CM

## 2022-07-18 DIAGNOSIS — E559 Vitamin D deficiency, unspecified: Secondary | ICD-10-CM | POA: Diagnosis not present

## 2022-07-18 DIAGNOSIS — E782 Mixed hyperlipidemia: Secondary | ICD-10-CM

## 2022-07-18 DIAGNOSIS — R7303 Prediabetes: Secondary | ICD-10-CM | POA: Diagnosis not present

## 2022-07-18 DIAGNOSIS — J4520 Mild intermittent asthma, uncomplicated: Secondary | ICD-10-CM

## 2022-07-18 DIAGNOSIS — M7989 Other specified soft tissue disorders: Secondary | ICD-10-CM

## 2022-07-18 DIAGNOSIS — E669 Obesity, unspecified: Secondary | ICD-10-CM

## 2022-07-18 LAB — CBC WITH DIFFERENTIAL/PLATELET
Basophils Absolute: 0.1 10*3/uL (ref 0.0–0.1)
Basophils Relative: 1.1 % (ref 0.0–3.0)
Eosinophils Absolute: 0.1 10*3/uL (ref 0.0–0.7)
Eosinophils Relative: 1.4 % (ref 0.0–5.0)
HCT: 39.4 % (ref 36.0–46.0)
Hemoglobin: 13.1 g/dL (ref 12.0–15.0)
Lymphocytes Relative: 22.4 % (ref 12.0–46.0)
Lymphs Abs: 2 10*3/uL (ref 0.7–4.0)
MCHC: 33.4 g/dL (ref 30.0–36.0)
MCV: 94.5 fl (ref 78.0–100.0)
Monocytes Absolute: 0.9 10*3/uL (ref 0.1–1.0)
Monocytes Relative: 10.2 % (ref 3.0–12.0)
Neutro Abs: 5.7 10*3/uL (ref 1.4–7.7)
Neutrophils Relative %: 64.9 % (ref 43.0–77.0)
Platelets: 266 10*3/uL (ref 150.0–400.0)
RBC: 4.17 Mil/uL (ref 3.87–5.11)
RDW: 14.3 % (ref 11.5–15.5)
WBC: 8.8 10*3/uL (ref 4.0–10.5)

## 2022-07-18 LAB — COMPREHENSIVE METABOLIC PANEL
ALT: 24 U/L (ref 0–35)
AST: 33 U/L (ref 0–37)
Albumin: 4.2 g/dL (ref 3.5–5.2)
Alkaline Phosphatase: 65 U/L (ref 39–117)
BUN: 10 mg/dL (ref 6–23)
CO2: 27 mEq/L (ref 19–32)
Calcium: 9.6 mg/dL (ref 8.4–10.5)
Chloride: 101 mEq/L (ref 96–112)
Creatinine, Ser: 0.94 mg/dL (ref 0.40–1.20)
GFR: 62.16 mL/min (ref >60.00)
Glucose, Bld: 104 mg/dL — ABNORMAL HIGH (ref 70–99)
Potassium: 3.9 mEq/L (ref 3.5–5.1)
Sodium: 140 mEq/L (ref 135–145)
Total Bilirubin: 0.5 mg/dL (ref 0.2–1.2)
Total Protein: 7.6 g/dL (ref 6.0–8.3)

## 2022-07-18 LAB — LIPID PANEL
Cholesterol: 212 mg/dL — ABNORMAL HIGH (ref 0–200)
HDL: 69.8 mg/dL (ref >39.00)
LDL Cholesterol: 115 mg/dL — ABNORMAL HIGH (ref 0–99)
NonHDL: 141.7
Total CHOL/HDL Ratio: 3
Triglycerides: 135 mg/dL (ref 0.0–149.0)
VLDL: 27 mg/dL (ref 0.0–40.0)

## 2022-07-18 LAB — VITAMIN D 25 HYDROXY (VIT D DEFICIENCY, FRACTURES): VITD: 47.34 ng/mL (ref 30.00–100.00)

## 2022-07-18 LAB — HEMOGLOBIN A1C: Hgb A1c MFr Bld: 6.5 % (ref 4.6–6.5)

## 2022-07-18 NOTE — Assessment & Plan Note (Signed)
Chronic dexa up to date with gyn

## 2022-07-18 NOTE — Assessment & Plan Note (Signed)
Chronic BP well controlled Continue hctz 12.5 mg daily cmp  

## 2022-07-18 NOTE — Assessment & Plan Note (Signed)
States she drinks one drink a day

## 2022-07-18 NOTE — Assessment & Plan Note (Signed)
Chronic Taking vitamin d daily Check vitamin d level  

## 2022-07-18 NOTE — Assessment & Plan Note (Signed)
Chronic Mild, intermittent Continue albuterol prn 

## 2022-07-18 NOTE — Assessment & Plan Note (Signed)
Chronic Check a1c Low sugar / carb diet Stressed regular exercise  

## 2022-07-18 NOTE — Assessment & Plan Note (Signed)
Chronic Stressed regular exercise, smaller portions - diet high in veges, fiber and protein - low in carbs, sweets

## 2022-07-18 NOTE — Assessment & Plan Note (Addendum)
Chronic Check lipid panel,cmp Continue zetia 10 mg daily, crestor 20 mg daily Regular exercise and healthy diet encouraged

## 2022-07-18 NOTE — Assessment & Plan Note (Addendum)
Chronic Controlled, stable Continue hctz 12.5 daily Encourage low-sodium diet, weight loss, regular exercise and wearing compression socks

## 2022-08-28 ENCOUNTER — Other Ambulatory Visit: Payer: Self-pay | Admitting: Internal Medicine

## 2022-09-10 IMAGING — MG MM DIGITAL SCREENING BILAT W/ TOMO AND CAD
6 of 10 series · 6 of 30 positions shown · non-contrast
Comparison: Previous exam(s).

CLINICAL DATA: Screening.

EXAM:
DIGITAL SCREENING BILATERAL MAMMOGRAM WITH TOMOSYNTHESIS AND CAD
TECHNIQUE: Bilateral screening digital craniocaudal and mediolateral oblique
mammograms were obtained. Bilateral screening digital breast
tomosynthesis was performed. The images were evaluated with
computer-aided detection.

[R MLO synth-2D]
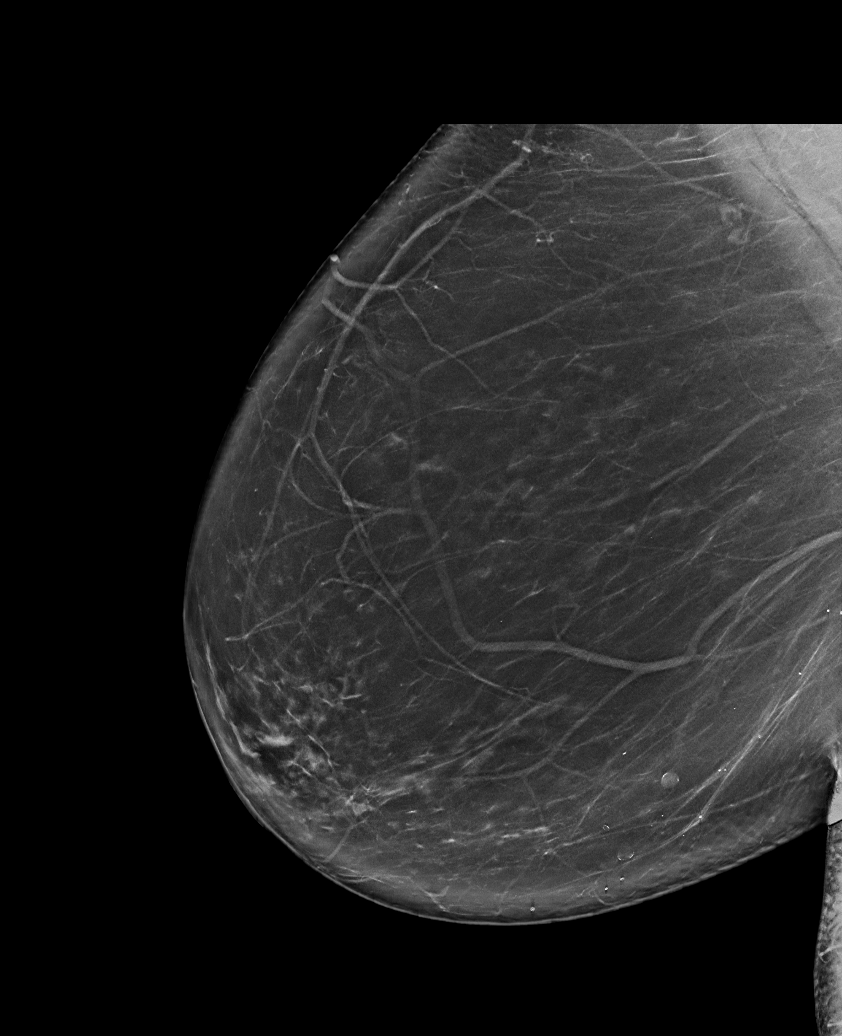

[R CC synth-2D]
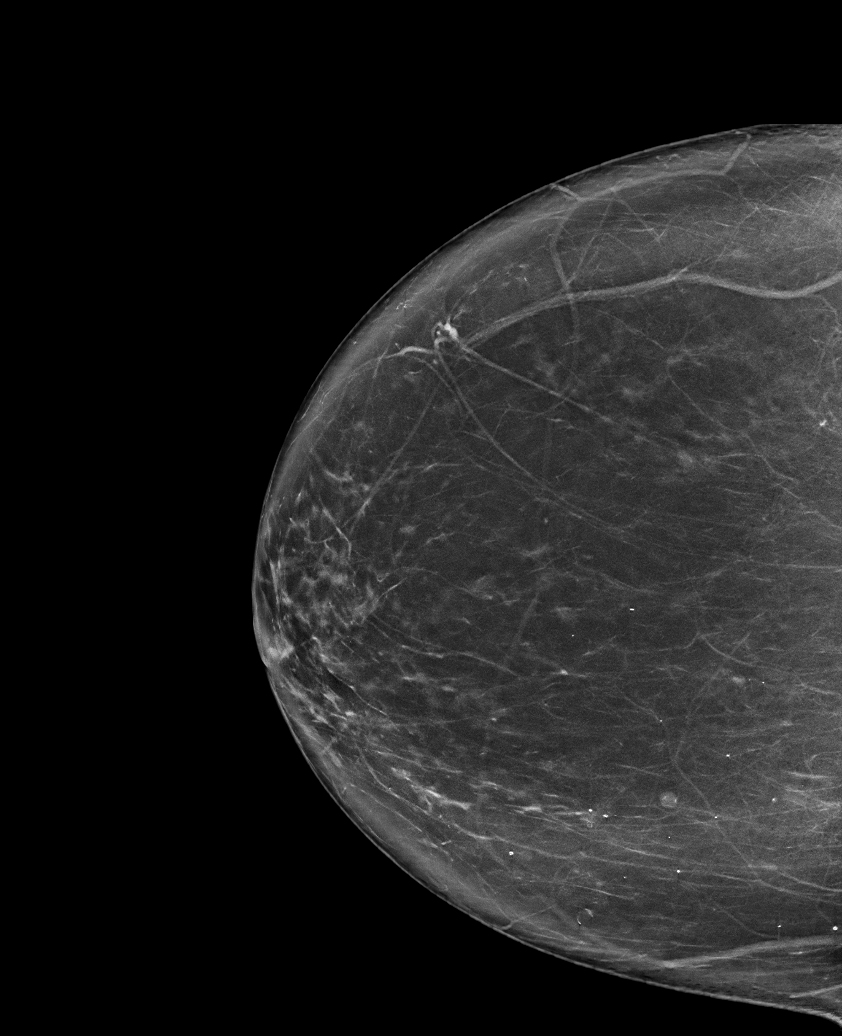

[L CC synth-2D]
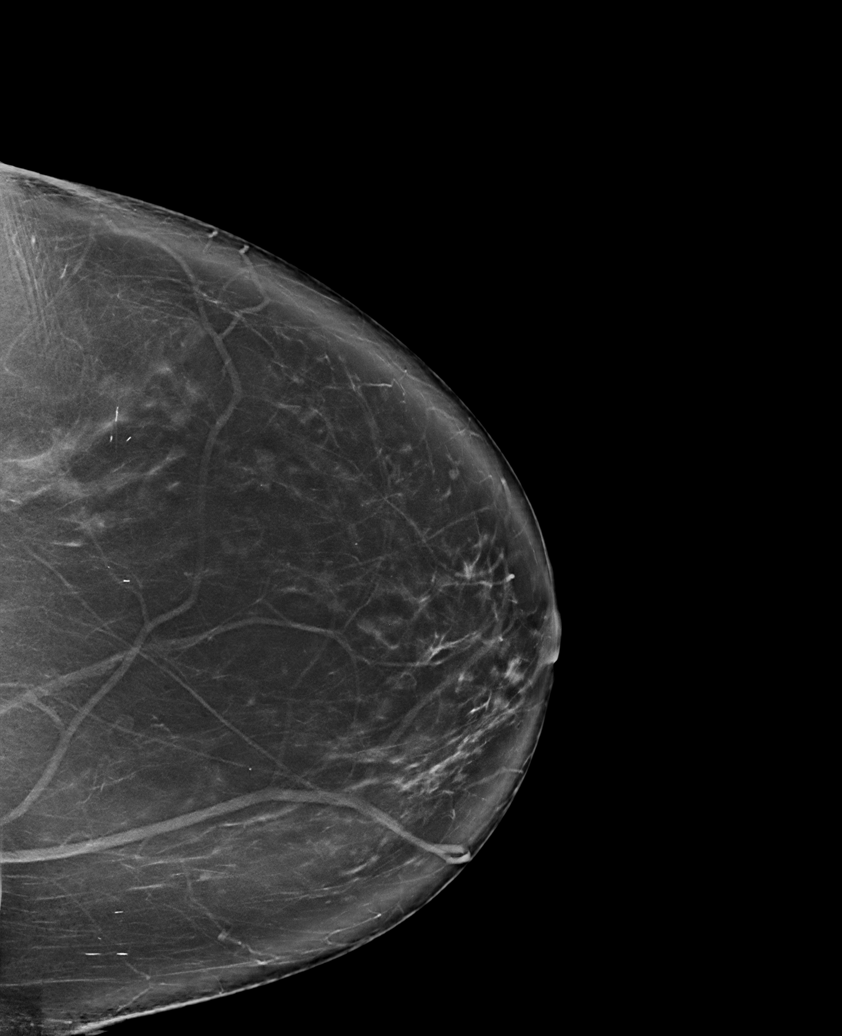

[L MLO synth-2D]
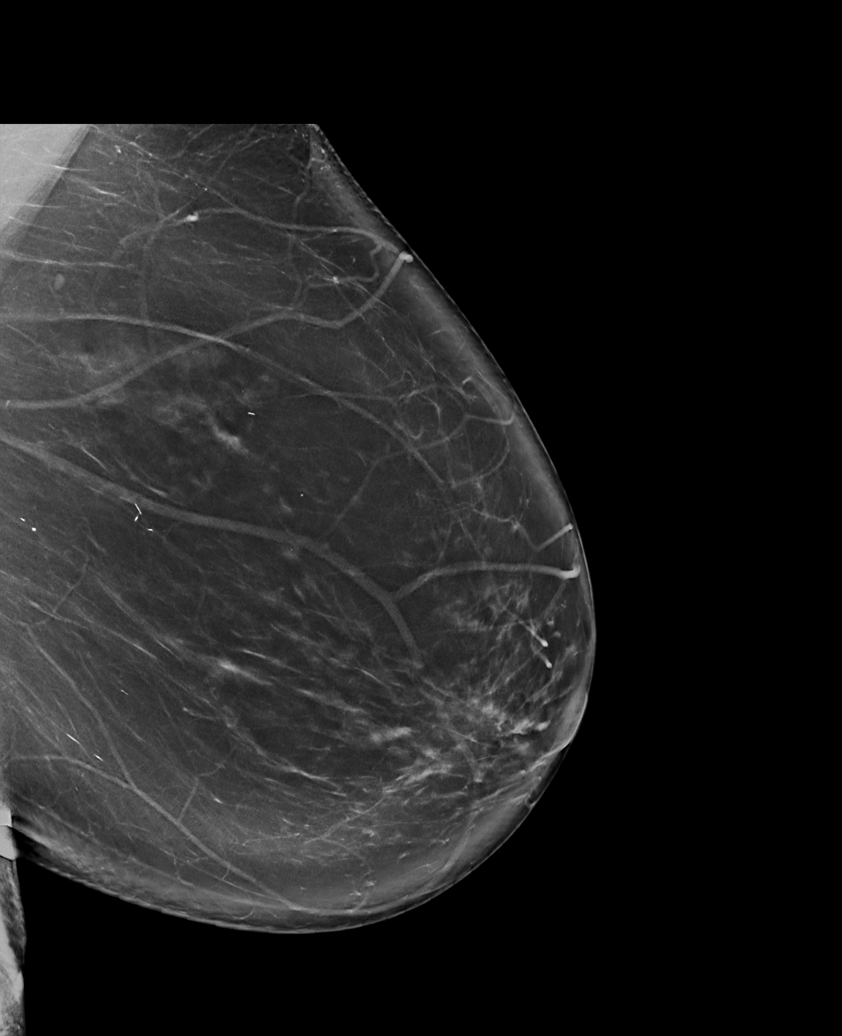

[R CV synth-2D]
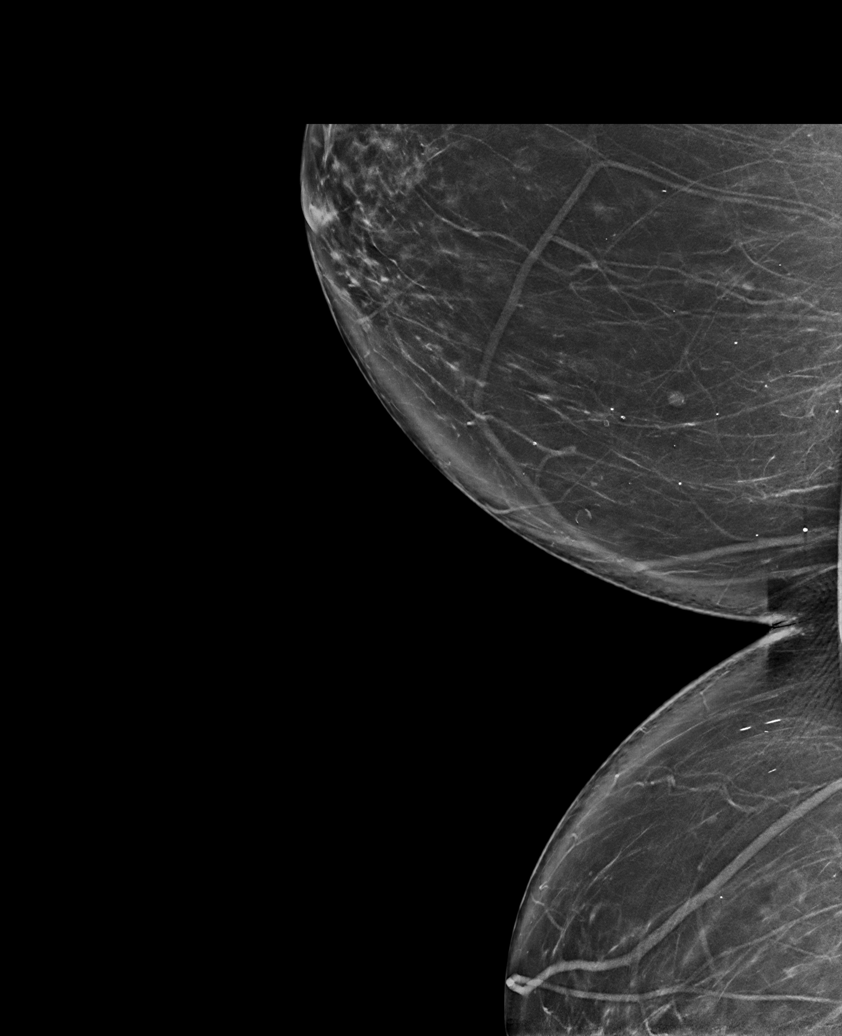

[R CC tomo · tomo slice 43/85.0]
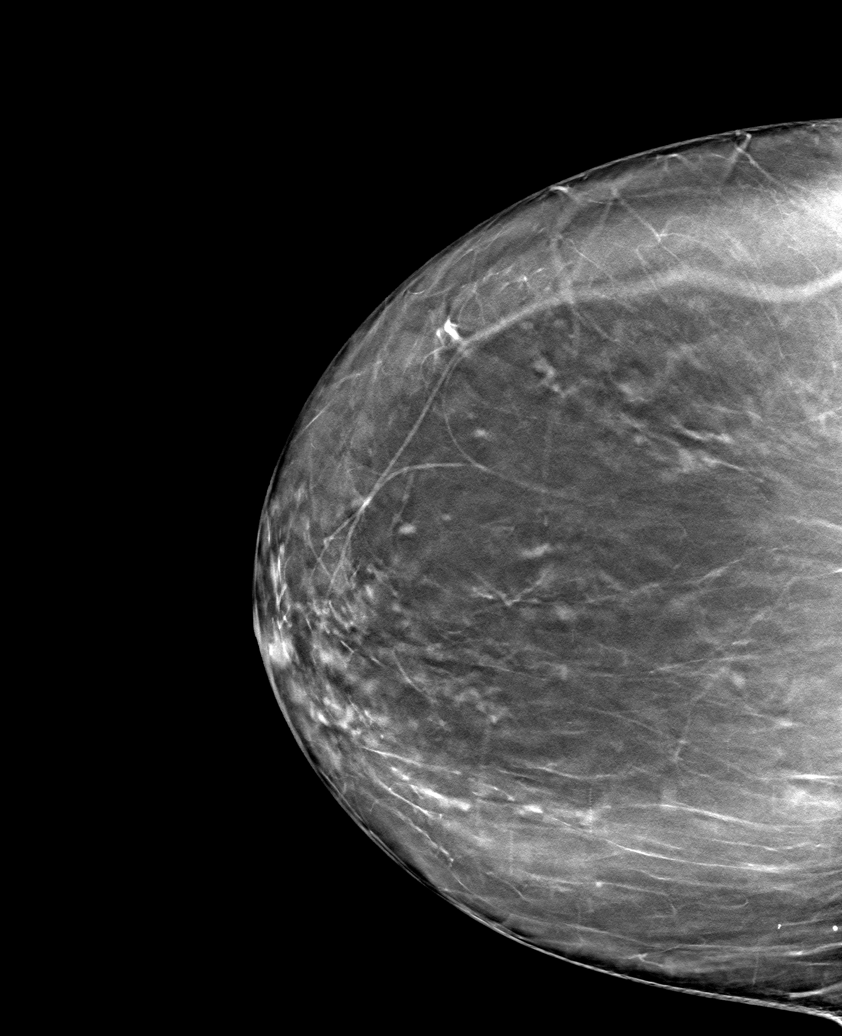

[6 of 30 positions shown; findings below may reference images not displayed]

ACR Breast Density Category b: There are scattered areas of
fibroglandular density.
FINDINGS: There are no findings suspicious for malignancy.
IMPRESSION: No mammographic evidence of malignancy. A result letter of this
screening mammogram will be mailed directly to the patient.

RECOMMENDATION:
Screening mammogram in one year. (Code:51-O-LD2)

BI-RADS CATEGORY  1: Negative.

## 2022-09-28 ENCOUNTER — Other Ambulatory Visit: Payer: Self-pay | Admitting: Obstetrics and Gynecology

## 2022-09-28 DIAGNOSIS — Z1231 Encounter for screening mammogram for malignant neoplasm of breast: Secondary | ICD-10-CM

## 2022-11-28 ENCOUNTER — Other Ambulatory Visit: Payer: Self-pay | Admitting: Internal Medicine

## 2022-11-29 ENCOUNTER — Ambulatory Visit
Admission: RE | Admit: 2022-11-29 | Discharge: 2022-11-29 | Disposition: A | Discharge status: Home / Self Care | Payer: 59 | Source: Ambulatory Visit | Attending: Obstetrics and Gynecology | Admitting: Obstetrics and Gynecology

## 2022-11-29 DIAGNOSIS — Z1231 Encounter for screening mammogram for malignant neoplasm of breast: Secondary | ICD-10-CM

## 2023-02-23 ENCOUNTER — Other Ambulatory Visit: Payer: Self-pay | Admitting: Internal Medicine

## 2023-02-28 ENCOUNTER — Telehealth: Payer: Self-pay | Admitting: Internal Medicine

## 2023-02-28 NOTE — Telephone Encounter (Signed)
Patient would like a referral to an ENT.  She was given one last year for her ear problems, but they cleared up and she never went.  Please call patient and let her know the name of the physician.  Phone number:  [phone]

## 2023-03-01 NOTE — Telephone Encounter (Signed)
Spoke with patient today and info given.  She has appointment on Monday for follow up and to complete referral.

## 2023-03-01 NOTE — Telephone Encounter (Signed)
The referral was for-  Dr Janeece Riggers Philomena Doheny MD, [place] [address] [place], [place] [address] [place] [address] [phone]    She can try calling up and scheduling her own appointment.  If a referral is needed they typically require an office visit

## 2023-03-05 ENCOUNTER — Other Ambulatory Visit: Payer: Self-pay | Admitting: Internal Medicine

## 2023-03-05 ENCOUNTER — Encounter: Payer: Self-pay | Admitting: Internal Medicine

## 2023-03-05 NOTE — Progress Notes (Unsigned)
      Subjective:    Patient ID: Sandra [name], female    DOB: 1951-07-03, 72 y.o.   MRN: [id number]     HPI Sandra [name] is here for follow up of her chronic medical problems.  She also needs a referral for ENT for bilateral eustachian tube dysfunction.  ?  1 DEXA  Medications and allergies reviewed with patient and updated if appropriate.  Current Outpatient Medications on File Prior to Visit  Medication Sig Dispense Refill   albuterol (VENTOLIN HFA) 108 (90 Base) MCG/ACT inhaler Inhale 2 puffs into the lungs every 6 (six) hours as needed for wheezing or shortness of breath. 6.7 g 2   Calcium Carbonate-Vitamin D 600-400 MG-UNIT tablet Take 1 tablet by mouth 2 (two) times daily.     Calcium Citrate-Vitamin D (CITRACAL + D PO) Citracal     ezetimibe (ZETIA) 10 MG tablet TAKE 1 TABLET BY MOUTH EVERY DAY 30 tablet 2   fluticasone (FLONASE) 50 MCG/ACT nasal spray Place 2 sprays into both nostrils daily. 16 g 6   hydrochlorothiazide (MICROZIDE) 12.5 MG capsule TAKE 1 CAPSULE BY MOUTH EVERY DAY 30 capsule 2   ketorolac (ACULAR) 0.5 % ophthalmic solution Place 1 drop into the left eye 3 (three) times daily.     levocetirizine (XYZAL) 5 MG tablet Take 1 tablet (5 mg total) by mouth every evening. 30 tablet 0   rosuvastatin (CRESTOR) 20 MG tablet TAKE 1 TABLET BY MOUTH EVERY DAY 30 tablet 2   triamcinolone ointment (KENALOG) 0.1 % Apply topically 2 (two) times daily.     valACYclovir (VALTREX) 1000 MG tablet Take by mouth.     valACYclovir (VALTREX) 500 MG tablet Take 1 tablet (500 mg total) by mouth 2 (two) times daily. Take for 3 days for outbreak 30 tablet 2   No current facility-administered medications on file prior to visit.     Review of Systems     Objective:  There were no vitals filed for this visit. BP Readings from Last 3 Encounters:  07/18/22 118/80  12/23/21 112/68  12/14/21 108/70   Wt Readings from Last 3 Encounters:  07/18/22 193 lb 2 oz (87.6 kg)  12/23/21 184  lb (83.5 kg)  12/14/21 186 lb (84.4 kg)   There is no height or weight on file to calculate BMI.    Physical Exam     Lab Results  Component Value Date   WBC 8.8 07/18/2022   HGB 13.1 07/18/2022   HCT 39.4 07/18/2022   PLT 266.0 07/18/2022   GLUCOSE 104 (H) 07/18/2022   CHOL 212 (H) 07/18/2022   TRIG 135.0 07/18/2022   HDL 69.80 07/18/2022   LDLDIRECT 120.0 06/28/2019   LDLCALC 115 (H) 07/18/2022   ALT 24 07/18/2022   AST 33 07/18/2022   NA 140 07/18/2022   K 3.9 07/18/2022   CL 101 07/18/2022   CREATININE 0.94 07/18/2022   BUN 10 07/18/2022   CO2 27 07/18/2022   TSH 2.66 07/13/2021   INR 1.08 06/05/2016   HGBA1C 6.5 07/18/2022     Assessment & Plan:    See Problem List for Assessment and Plan of chronic medical problems.

## 2023-03-05 NOTE — Patient Instructions (Addendum)
      Blood work was ordered.   The lab is on the first floor.    Medications changes include :   Augmentin x 10 days, prednisone 50 mg x 5 days     Follow up in August as scheduled.

## 2023-03-06 ENCOUNTER — Encounter: Payer: Self-pay | Admitting: Internal Medicine

## 2023-03-06 ENCOUNTER — Ambulatory Visit: Payer: 59 | Admitting: Internal Medicine

## 2023-03-06 ENCOUNTER — Other Ambulatory Visit: Payer: Self-pay

## 2023-03-06 VITALS — BP 124/78 | HR 85 | Temp 98.5°F | Ht 62.0 in | Wt 189.0 lb

## 2023-03-06 DIAGNOSIS — E559 Vitamin D deficiency, unspecified: Secondary | ICD-10-CM

## 2023-03-06 DIAGNOSIS — M7989 Other specified soft tissue disorders: Secondary | ICD-10-CM

## 2023-03-06 DIAGNOSIS — J4520 Mild intermittent asthma, uncomplicated: Secondary | ICD-10-CM

## 2023-03-06 DIAGNOSIS — M8580 Other specified disorders of bone density and structure, unspecified site: Secondary | ICD-10-CM

## 2023-03-06 DIAGNOSIS — R7303 Prediabetes: Secondary | ICD-10-CM | POA: Diagnosis not present

## 2023-03-06 DIAGNOSIS — J069 Acute upper respiratory infection, unspecified: Secondary | ICD-10-CM | POA: Insufficient documentation

## 2023-03-06 DIAGNOSIS — E669 Obesity, unspecified: Secondary | ICD-10-CM

## 2023-03-06 DIAGNOSIS — F1011 Alcohol abuse, in remission: Secondary | ICD-10-CM

## 2023-03-06 DIAGNOSIS — E782 Mixed hyperlipidemia: Secondary | ICD-10-CM

## 2023-03-06 DIAGNOSIS — H6993 Unspecified Eustachian tube disorder, bilateral: Secondary | ICD-10-CM | POA: Diagnosis not present

## 2023-03-06 DIAGNOSIS — J309 Allergic rhinitis, unspecified: Secondary | ICD-10-CM

## 2023-03-06 LAB — COMPREHENSIVE METABOLIC PANEL
ALT: 26 U/L (ref 0–35)
AST: 32 U/L (ref 0–37)
Albumin: 4.4 g/dL (ref 3.5–5.2)
Alkaline Phosphatase: 71 U/L (ref 39–117)
BUN: 14 mg/dL (ref 6–23)
CO2: 27 mEq/L (ref 19–32)
Calcium: 9.6 mg/dL (ref 8.4–10.5)
Chloride: 101 mEq/L (ref 96–112)
Creatinine, Ser: 0.93 mg/dL (ref 0.40–1.20)
GFR: 62.69 mL/min (ref >60.00)
Glucose, Bld: 91 mg/dL (ref 70–99)
Potassium: 4 mEq/L (ref 3.5–5.1)
Sodium: 139 mEq/L (ref 135–145)
Total Bilirubin: 0.5 mg/dL (ref 0.2–1.2)
Total Protein: 8 g/dL (ref 6.0–8.3)

## 2023-03-06 LAB — LIPID PANEL
Cholesterol: 221 mg/dL — ABNORMAL HIGH (ref 0–200)
HDL: 78.4 mg/dL (ref >39.00)
LDL Cholesterol: 112 mg/dL — ABNORMAL HIGH (ref 0–99)
NonHDL: 142.71
Total CHOL/HDL Ratio: 3
Triglycerides: 154 mg/dL — ABNORMAL HIGH (ref 0.0–149.0)
VLDL: 30.8 mg/dL (ref 0.0–40.0)

## 2023-03-06 LAB — HEMOGLOBIN A1C: Hgb A1c MFr Bld: 6.1 % (ref 4.6–6.5)

## 2023-03-06 LAB — VITAMIN D 25 HYDROXY (VIT D DEFICIENCY, FRACTURES): VITD: 47.61 ng/mL (ref 30.00–100.00)

## 2023-03-06 MED ORDER — ALBUTEROL SULFATE HFA 108 (90 BASE) MCG/ACT IN AERS: 2.0000 | INHALATION_SPRAY | Freq: Four times a day (QID) | RESPIRATORY_TRACT | 2 refills | Status: DC | PRN

## 2023-03-06 MED ORDER — AMOXICILLIN-POT CLAVULANATE 875-125 MG PO TABS
1.0000 | ORAL_TABLET | Freq: Two times a day (BID) | ORAL | 0 refills | Status: DC
Start: 2023-03-06 — End: 2023-09-20

## 2023-03-06 MED ORDER — PREDNISONE 50 MG PO TABS
50.0000 mg | ORAL_TABLET | Freq: Every day | ORAL | 0 refills | Status: DC
Start: 2023-03-06 — End: 2023-09-20

## 2023-03-06 NOTE — Assessment & Plan Note (Signed)
Chronic BMI 34.57 Stressed regular exercise Encouraged healthy diet, weight loss Advised decreasing alcohol intake

## 2023-03-06 NOTE — Assessment & Plan Note (Signed)
Acute Concern for bacterial cause No significant exacerbation and asthma, will start using albuterol inhaler as needed-refilled Start Augmentin 875-125 mg BID x 10 day otc cold medications Rest, fluid Call if no improvement

## 2023-03-06 NOTE — Assessment & Plan Note (Signed)
At 1 point was drinking 4-6 beers a night Still drinking 1-2 beers nightly Advised trying to further decrease this and not drink on a nightly basis She will try to decrease her intake

## 2023-03-06 NOTE — Assessment & Plan Note (Signed)
Chronic Check a1c Low sugar / carb diet Stressed regular exercise  

## 2023-03-06 NOTE — Assessment & Plan Note (Signed)
Chronic Regular exercise and healthy diet encouraged Check lipid panel, CMP Continue Zetia 10 mg daily and Crestor 20 mg daily

## 2023-03-06 NOTE — Assessment & Plan Note (Signed)
Chronic Controlled, stable Continue hctz 12.5 daily

## 2023-03-06 NOTE — Assessment & Plan Note (Signed)
Chronic Taking vitamin D daily Check vitamin D level  

## 2023-03-06 NOTE — Assessment & Plan Note (Signed)
Chronic DEXA done by her gynecologist and is up-to-date Encouraged regular exercise Continue calcium and vitamin D daily Check vitamin D level

## 2023-03-06 NOTE — Assessment & Plan Note (Signed)
Chronic Overall controlled Uses albuterol inhaler as needed-her current prescription is expired and she is having some mild wheeze and occasional shortness of breath at times from her upper respiratory infection No real asthma exacerbation Albuterol refilled-use as needed

## 2023-03-06 NOTE — Assessment & Plan Note (Signed)
Acute Related to acute URI She has been taking Mucinex DM Discussed that she can restart Flonase Prednisone 50 mg daily x 5 days If no improvement she will let me know and I will refer to ENT

## 2023-05-01 ENCOUNTER — Other Ambulatory Visit: Payer: Self-pay | Admitting: Internal Medicine

## 2023-06-09 ENCOUNTER — Telehealth: Payer: Self-pay | Admitting: Internal Medicine

## 2023-06-09 MED ORDER — HYDROCHLOROTHIAZIDE 12.5 MG PO CAPS: 12.5000 mg | ORAL_CAPSULE | Freq: Every day | ORAL | 0 refills | Status: DC

## 2023-06-09 NOTE — Telephone Encounter (Signed)
Prescription Request  06/09/2023  LOV: 03/06/2023  What is the name of the medication or equipment? hydrochlorothiazide (MICROZIDE) 12.5 MG capsule   Have you contacted your pharmacy to request a refill? No   Which pharmacy would you like this sent to?  [id number] [place], [place] - [address] [place] [address] Phone: [phone] Fax: [phone]    Patient notified that their request is being sent to the clinical staff for review and that they should receive a response within 2 business days.   Please advise at Mobile [phone] (mobile)

## 2023-06-12 NOTE — Telephone Encounter (Signed)
Refill already sent in on 06/09/23 to requested pharmacy.

## 2023-06-21 ENCOUNTER — Encounter (INDEPENDENT_AMBULATORY_CARE_PROVIDER_SITE_OTHER): Payer: Self-pay

## 2023-07-01 ENCOUNTER — Other Ambulatory Visit: Payer: Self-pay | Admitting: Internal Medicine

## 2023-07-18 ENCOUNTER — Other Ambulatory Visit: Payer: Self-pay | Admitting: Internal Medicine

## 2023-07-21 ENCOUNTER — Encounter: Payer: 59 | Admitting: Internal Medicine

## 2023-08-05 ENCOUNTER — Other Ambulatory Visit: Payer: Self-pay | Admitting: Internal Medicine

## 2023-09-02 ENCOUNTER — Other Ambulatory Visit: Payer: Self-pay | Admitting: Internal Medicine

## 2023-09-08 ENCOUNTER — Encounter: Payer: 59 | Admitting: Internal Medicine

## 2023-09-11 ENCOUNTER — Encounter: Payer: 59 | Admitting: Internal Medicine

## 2023-09-20 ENCOUNTER — Encounter: Payer: Self-pay | Admitting: Internal Medicine

## 2023-09-20 ENCOUNTER — Ambulatory Visit: Payer: 59 | Admitting: Internal Medicine

## 2023-09-20 VITALS — BP 108/72 | HR 81 | Temp 98.4°F | Ht 62.0 in | Wt 180.0 lb

## 2023-09-20 DIAGNOSIS — L03012 Cellulitis of left finger: Secondary | ICD-10-CM | POA: Insufficient documentation

## 2023-09-20 DIAGNOSIS — R051 Acute cough: Secondary | ICD-10-CM

## 2023-09-20 MED ORDER — AMOXICILLIN-POT CLAVULANATE 875-125 MG PO TABS: 1.0000 | ORAL_TABLET | Freq: Two times a day (BID) | ORAL | 0 refills | Status: AC

## 2023-09-20 NOTE — Assessment & Plan Note (Signed)
Acute Improved slightly with soaking it but still infected Continue soaking in warm water w/ epsom salts Start Augmentin bid x 7 days Use inhaler regularly for a few days Otc cold meds as needed

## 2023-09-20 NOTE — Assessment & Plan Note (Signed)
Acute Related to URI, PND that has been persistent and possible asthma exacerbation Going on Augmentin bid x 7 days for paronychia which may help Minimal wheeze on exam  Use inhaler regularly for a few days Otc cold meds for symptoms relief

## 2023-09-20 NOTE — Patient Instructions (Addendum)
      Medications changes include :   Augmentin twice daily for one week     Return if symptoms worsen or fail to improve.

## 2023-09-20 NOTE — Progress Notes (Signed)
Subjective:    Patient ID: Sandra [name], female    DOB: January 21, 1951, 72 y.o.   MRN: [id number]      HPI [name] is here for  Chief Complaint  Patient presents with   Hand Pain    Left pointer finger (redness/swelling noted); throbbing sensation yesterday   Nasal drainage    Nasal drainage x 3 weeks still causing lingering cough    Left pointer finger -  the medial aspect of left pointer finger  about 10 days became tender, red and swollen.  Has been soaking it in warm epsom salt and then H2O2.  It peeled a little yesterday and looks a little better.  She denies any discharge, no fever/chills.     Nasal drainage, cough - a coupe of weeks.  Has had nasal congestion which has improved.  Has mild sinus pressure, lots of PND and a cough and is bringing up some mucus.  The drainage and cough have been persistent.  She has been using her inhaler and taking  alka seltzer plus   Medications and allergies reviewed with patient and updated if appropriate.  Current Outpatient Medications on File Prior to Visit  Medication Sig Dispense Refill   albuterol (VENTOLIN HFA) 108 (90 Base) MCG/ACT inhaler Inhale 2 puffs into the lungs every 6 (six) hours as needed for wheezing or shortness of breath. 6.7 g 2   Calcium Carbonate-Vitamin D 600-400 MG-UNIT tablet Take 1 tablet by mouth 2 (two) times daily.     Calcium Citrate-Vitamin D (CITRACAL + D PO) Citracal     ezetimibe (ZETIA) 10 MG tablet TAKE 1 TABLET BY MOUTH EVERY DAY 30 tablet 2   fluticasone (FLONASE) 50 MCG/ACT nasal spray Place 2 sprays into both nostrils daily. 16 g 6   hydrochlorothiazide (MICROZIDE) 12.5 MG capsule TAKE 1 CAPSULE (12.5 MG TOTAL) BY MOUTH DAILY. ANNUAL APPT DUE IN SEPT MUST SEE PROVIDER FOR FUTURE REFILLS 30 capsule 0   ketorolac (ACULAR) 0.5 % ophthalmic solution Place 1 drop into the left eye 3 (three) times daily.     levocetirizine (XYZAL) 5 MG tablet Take 1 tablet (5 mg total) by mouth every evening. 30 tablet  0   predniSONE (DELTASONE) 50 MG tablet Take 1 tablet (50 mg total) by mouth daily with breakfast. 5 tablet 0   rosuvastatin (CRESTOR) 20 MG tablet TAKE 1 TABLET BY MOUTH EVERY DAY 30 tablet 2   triamcinolone ointment (KENALOG) 0.1 % Apply topically 2 (two) times daily.     valACYclovir (VALTREX) 1000 MG tablet Take by mouth.     valACYclovir (VALTREX) 500 MG tablet Take 1 tablet (500 mg total) by mouth 2 (two) times daily. Take for 3 days for outbreak 30 tablet 2   No current facility-administered medications on file prior to visit.    Review of Systems  Constitutional:  Negative for chills and fever.  HENT:  Positive for congestion (improved), postnasal drip and sinus pressure. Negative for ear pain and sore throat.   Respiratory:  Positive for cough (coughing a little mucus up at times). Negative for shortness of breath and wheezing (a little rattling - no wheeze).   Neurological:  Negative for dizziness and headaches.       Objective:   Vitals:   09/20/23 1448  BP: 108/72  Pulse: 81  Temp: 98.4 F (36.9 C)  SpO2: 95%   BP Readings from Last 3 Encounters:  09/20/23 108/72  03/06/23 124/78  07/18/22 118/80   Wt  Readings from Last 3 Encounters:  09/20/23 180 lb (81.6 kg)  03/06/23 189 lb (85.7 kg)  07/18/22 193 lb 2 oz (87.6 kg)   Body mass index is 32.92 kg/m.    Physical Exam Constitutional:      General: She is not in acute distress.    Appearance: Normal appearance. She is not ill-appearing.  HENT:     Head: Normocephalic and atraumatic.     Right Ear: Tympanic membrane, ear canal and external ear normal.     Left Ear: Tympanic membrane, ear canal and external ear normal.     Mouth/Throat:     Mouth: Mucous membranes are moist.     Pharynx: No oropharyngeal exudate or posterior oropharyngeal erythema.  Eyes:     Conjunctiva/sclera: Conjunctivae normal.  Cardiovascular:     Rate and Rhythm: Normal rate and regular rhythm.  Pulmonary:     Effort: Pulmonary  effort is normal. No respiratory distress.     Breath sounds: Wheezing (occ fine exp wheeze) present. No rales.  Musculoskeletal:     Cervical back: Neck supple. No tenderness.  Lymphadenopathy:     Cervical: No cervical adenopathy.  Skin:    General: Skin is warm and dry.     Comments: Left index finger lateral proximal aspect of nail there is erythema, swelling that is mild and tenderness.  No fluctuance or discharge.  Normal ROM, sensation  Neurological:     Mental Status: She is alert.            Assessment & Plan:    See Problem List for Assessment and Plan of chronic medical problems.

## 2023-09-26 ENCOUNTER — Other Ambulatory Visit: Payer: Self-pay | Admitting: Internal Medicine

## 2023-09-26 DIAGNOSIS — Z1231 Encounter for screening mammogram for malignant neoplasm of breast: Secondary | ICD-10-CM

## 2023-09-30 ENCOUNTER — Other Ambulatory Visit: Payer: Self-pay | Admitting: Internal Medicine

## 2023-10-12 ENCOUNTER — Encounter: Payer: Self-pay | Admitting: Internal Medicine

## 2023-10-12 NOTE — Patient Instructions (Addendum)
Blood work was ordered.       Medications changes include :   None     Return in about 6 months (around 04/11/2024) for follow up.     Health Maintenance, Female Adopting a healthy lifestyle and getting preventive care are important in promoting health and wellness. Ask your health care provider about: The right schedule for you to have regular tests and exams. Things you can do on your own to prevent diseases and keep yourself healthy. What should I know about diet, weight, and exercise? Eat a healthy diet  Eat a diet that includes plenty of vegetables, fruits, low-fat dairy products, and lean protein. Do not eat a lot of foods that are high in solid fats, added sugars, or sodium. Maintain a healthy weight Body mass index (BMI) is used to identify weight problems. It estimates body fat based on height and weight. Your health care provider can help determine your BMI and help you achieve or maintain a healthy weight. Get regular exercise Get regular exercise. This is one of the most important things you can do for your health. Most adults should: Exercise for at least 150 minutes each week. The exercise should increase your heart rate and make you sweat (moderate-intensity exercise). Do strengthening exercises at least twice a week. This is in addition to the moderate-intensity exercise. Spend less time sitting. Even light physical activity can be beneficial. Watch cholesterol and blood lipids Have your blood tested for lipids and cholesterol at 70 years of age, then have this test every 5 years. Have your cholesterol levels checked more often if: Your lipid or cholesterol levels are high. You are older than 70 years of age. You are at high risk for heart disease. What should I know about cancer screening? Depending on your health history and family history, you may need to have cancer screening at various ages. This may include screening for: Breast cancer. Cervical  cancer. Colorectal cancer. Skin cancer. Lung cancer. What should I know about heart disease, diabetes, and high blood pressure? Blood pressure and heart disease High blood pressure causes heart disease and increases the risk of stroke. This is more likely to develop in people who have high blood pressure readings or are overweight. Have your blood pressure checked: Every 3-5 years if you are 72-55 years of age. Every year if you are 29 years old or older. Diabetes Have regular diabetes screenings. This checks your fasting blood sugar level. Have the screening done: Once every three years after age 69 if you are at a normal weight and have a low risk for diabetes. More often and at a younger age if you are overweight or have a high risk for diabetes. What should I know about preventing infection? Hepatitis B If you have a higher risk for hepatitis B, you should be screened for this virus. Talk with your health care provider to find out if you are at risk for hepatitis B infection. Hepatitis C Testing is recommended for: Everyone born from [birth year] through 1963. Anyone with known risk factors for hepatitis C. Sexually transmitted infections (STIs) Get screened for STIs, including gonorrhea and chlamydia, if: You are sexually active and are younger than 70 years of age. You are older than 70 years of age and your health care provider tells you that you are at risk for this type of infection. Your sexual activity has changed since you were last screened, and you are at increased risk for chlamydia  or gonorrhea. Ask your health care provider if you are at risk. Ask your health care provider about whether you are at high risk for HIV. Your health care provider may recommend a prescription medicine to help prevent HIV infection. If you choose to take medicine to prevent HIV, you should first get tested for HIV. You should then be tested every 3 months for as long as you are taking the  medicine. Pregnancy If you are about to stop having your period (premenopausal) and you may become pregnant, seek counseling before you get pregnant. Take 400 to 800 micrograms (mcg) of folic acid every day if you become pregnant. Ask for birth control (contraception) if you want to prevent pregnancy. Osteoporosis and menopause Osteoporosis is a disease in which the bones lose minerals and strength with aging. This can result in bone fractures. If you are 44 years old or older, or if you are at risk for osteoporosis and fractures, ask your health care provider if you should: Be screened for bone loss. Take a calcium or vitamin D supplement to lower your risk of fractures. Be given hormone replacement therapy (HRT) to treat symptoms of menopause. Follow these instructions at home: Alcohol use Do not drink alcohol if: Your health care provider tells you not to drink. You are pregnant, may be pregnant, or are planning to become pregnant. If you drink alcohol: Limit how much you have to: 0-1 drink a day. Know how much alcohol is in your drink. In the [place] one drink equals one 12 oz bottle of beer (355 mL), one 5 oz glass of wine (148 mL), or one 1 oz glass of hard liquor (44 mL). Lifestyle Do not use any products that contain nicotine or tobacco. These products include cigarettes, chewing tobacco, and vaping devices, such as e-cigarettes. If you need help quitting, ask your health care provider. Do not use street drugs. Do not share needles. Ask your health care provider for help if you need support or information about quitting drugs. General instructions Schedule regular health, dental, and eye exams. Stay current with your vaccines. Tell your health care provider if: You often feel depressed. You have ever been abused or do not feel safe at home. Summary Adopting a healthy lifestyle and getting preventive care are important in promoting health and wellness. Follow your health care  provider's instructions about healthy diet, exercising, and getting tested or screened for diseases. Follow your health care provider's instructions on monitoring your cholesterol and blood pressure. This information is not intended to replace advice given to you by your health care provider. Make sure you discuss any questions you have with your health care provider. Document Revised: 03/29/2021 Document Reviewed: 03/29/2021 Elsevier Patient Education  2024 [employer].

## 2023-10-12 NOTE — Progress Notes (Signed)
Subjective:    Patient ID: Sandra [name], female    DOB: [birth year], 72 y.o.   MRN: [id number]      HPI Sandra [name] is here for a Physical exam and her chronic medical problems.    Nothing new.  No concerns.     Medications and allergies reviewed with patient and updated if appropriate.  Current Outpatient Medications on File Prior to Visit  Medication Sig Dispense Refill   albuterol (VENTOLIN HFA) 108 (90 Base) MCG/ACT inhaler Inhale 2 puffs into the lungs every 6 (six) hours as needed for wheezing or shortness of breath. 6.7 g 2   Calcium Carbonate-Vitamin D 600-400 MG-UNIT tablet Take 1 tablet by mouth 2 (two) times daily.     Calcium Citrate-Vitamin D (CITRACAL + D PO) Citracal     ezetimibe (ZETIA) 10 MG tablet TAKE 1 TABLET BY MOUTH EVERY DAY 30 tablet 2   fluticasone (FLONASE) 50 MCG/ACT nasal spray Place 2 sprays into both nostrils daily. 16 g 6   hydrochlorothiazide (MICROZIDE) 12.5 MG capsule TAKE 1 CAPSULE (12.5 MG TOTAL) BY MOUTH DAILY. ANNUAL APPT DUE IN SEPT MUST SEE PROVIDER FOR FUTURE REFILLS 30 capsule 0   ketorolac (ACULAR) 0.5 % ophthalmic solution Place 1 drop into the left eye 3 (three) times daily.     levocetirizine (XYZAL) 5 MG tablet Take 1 tablet (5 mg total) by mouth every evening. 30 tablet 0   rosuvastatin (CRESTOR) 20 MG tablet TAKE 1 TABLET BY MOUTH EVERY DAY 30 tablet 2   triamcinolone ointment (KENALOG) 0.1 % Apply topically 2 (two) times daily.     valACYclovir (VALTREX) 1000 MG tablet Take by mouth.     valACYclovir (VALTREX) 500 MG tablet Take 1 tablet (500 mg total) by mouth 2 (two) times daily. Take for 3 days for outbreak 30 tablet 2   No current facility-administered medications on file prior to visit.    Review of Systems  Constitutional:  Negative for fever.  Eyes:  Negative for visual disturbance.  Respiratory:  Negative for cough, shortness of breath and wheezing.   Cardiovascular:  Negative for chest pain, palpitations and leg  swelling.  Gastrointestinal:  Negative for abdominal pain, blood in stool, constipation and diarrhea.       Occ gerd  Genitourinary:  Negative for dysuria.  Musculoskeletal:  Negative for arthralgias and back pain.  Skin:  Negative for rash.  Neurological:  Negative for light-headedness and headaches.  Psychiatric/Behavioral:  Negative for dysphoric mood. The patient is not nervous/anxious.        Objective:   Vitals:   10/13/23 1316  BP: 110/72  Pulse: 70  Temp: 98.5 F (36.9 C)  SpO2: 98%   Filed Weights   10/13/23 1316  Weight: 181 lb (82.1 kg)   Body mass index is 33.11 kg/m.  BP Readings from Last 3 Encounters:  10/13/23 110/72  09/20/23 108/72  03/06/23 124/78    Wt Readings from Last 3 Encounters:  10/13/23 181 lb (82.1 kg)  09/20/23 180 lb (81.6 kg)  03/06/23 189 lb (85.7 kg)       Physical Exam Constitutional: She appears well-developed and well-nourished. No distress.  HENT:  Head: Normocephalic and atraumatic.  Right Ear: External ear normal. Normal ear canal and TM Left Ear: External ear normal.  Normal ear canal and TM Mouth/Throat: Oropharynx is clear and moist.  Eyes: Conjunctivae normal.  Neck: Neck supple. No tracheal deviation present. No thyromegaly present.  No carotid bruit  Cardiovascular:  Normal rate, regular rhythm and normal heart sounds.   No murmur heard.  No edema. Pulmonary/Chest: Effort normal and breath sounds normal. No respiratory distress. She has no wheezes. She has no rales.  Breast: deferred   Abdominal: Soft. She exhibits no distension. There is no tenderness.  Lymphadenopathy: She has no cervical adenopathy.  Skin: Skin is warm and dry. She is not diaphoretic.  Psychiatric: She has a normal mood and affect. Her behavior is normal.     Lab Results  Component Value Date   WBC 8.8 07/18/2022   HGB 13.1 07/18/2022   HCT 39.4 07/18/2022   PLT 266.0 07/18/2022   GLUCOSE 91 03/06/2023   CHOL 221 (H) 03/06/2023    TRIG 154.0 (H) 03/06/2023   HDL 78.40 03/06/2023   LDLDIRECT 120.0 06/28/2019   LDLCALC 112 (H) 03/06/2023   ALT 26 03/06/2023   AST 32 03/06/2023   NA 139 03/06/2023   K 4.0 03/06/2023   CL 101 03/06/2023   CREATININE 0.93 03/06/2023   BUN 14 03/06/2023   CO2 27 03/06/2023   TSH 2.66 07/13/2021   INR 1.08 06/05/2016   HGBA1C 6.1 03/06/2023         Assessment & Plan:   Physical exam: Screening blood work  ordered Exercise  not regular - walking occasionally - encouraged regular exercise Weight  encouraged weight loss Substance abuse  none   Reviewed recommended immunizations.   Health Maintenance  Topic Date Due   COVID-19 Vaccine (4 - 2023-24 season) 10/29/2023 (Originally 07/23/2023)   DEXA SCAN  10/12/2024 (Originally 08/17/2020)   MAMMOGRAM  11/29/2024   Colonoscopy  10/03/2026   DTaP/Tdap/Td (2 - Td or Tdap) 07/14/2031   Pneumonia Vaccine 15+ Years old  Completed   INFLUENZA VACCINE  Completed   Hepatitis C Screening  Completed   Zoster Vaccines- Shingrix  Completed   HPV VACCINES  Aged Out          See Problem List for Assessment and Plan of chronic medical problems.

## 2023-10-13 ENCOUNTER — Ambulatory Visit (INDEPENDENT_AMBULATORY_CARE_PROVIDER_SITE_OTHER): Payer: 59 | Admitting: Internal Medicine

## 2023-10-13 VITALS — BP 110/72 | HR 70 | Temp 98.5°F | Ht 62.0 in | Wt 181.0 lb

## 2023-10-13 DIAGNOSIS — R7303 Prediabetes: Secondary | ICD-10-CM | POA: Diagnosis not present

## 2023-10-13 DIAGNOSIS — E559 Vitamin D deficiency, unspecified: Secondary | ICD-10-CM

## 2023-10-13 DIAGNOSIS — F1011 Alcohol abuse, in remission: Secondary | ICD-10-CM

## 2023-10-13 DIAGNOSIS — E669 Obesity, unspecified: Secondary | ICD-10-CM

## 2023-10-13 DIAGNOSIS — M8580 Other specified disorders of bone density and structure, unspecified site: Secondary | ICD-10-CM

## 2023-10-13 DIAGNOSIS — Z0000 Encounter for general adult medical examination without abnormal findings: Secondary | ICD-10-CM

## 2023-10-13 DIAGNOSIS — E782 Mixed hyperlipidemia: Secondary | ICD-10-CM

## 2023-10-13 DIAGNOSIS — J4520 Mild intermittent asthma, uncomplicated: Secondary | ICD-10-CM

## 2023-10-13 DIAGNOSIS — J309 Allergic rhinitis, unspecified: Secondary | ICD-10-CM

## 2023-10-13 DIAGNOSIS — M7989 Other specified soft tissue disorders: Secondary | ICD-10-CM

## 2023-10-13 LAB — CBC WITH DIFFERENTIAL/PLATELET
Basophils Absolute: 0.1 10*3/uL (ref 0.0–0.1)
Basophils Relative: 1.1 % (ref 0.0–3.0)
Eosinophils Absolute: 0.2 10*3/uL (ref 0.0–0.7)
Eosinophils Relative: 1.8 % (ref 0.0–5.0)
HCT: 43.1 % (ref 36.0–46.0)
Hemoglobin: 14 g/dL (ref 12.0–15.0)
Lymphocytes Relative: 24.6 % (ref 12.0–46.0)
Lymphs Abs: 2.1 10*3/uL (ref 0.7–4.0)
MCHC: 32.5 g/dL (ref 30.0–36.0)
MCV: 95.2 fL (ref 78.0–100.0)
Monocytes Absolute: 0.9 10*3/uL (ref 0.1–1.0)
Monocytes Relative: 10.2 % (ref 3.0–12.0)
Neutro Abs: 5.3 10*3/uL (ref 1.4–7.7)
Neutrophils Relative %: 62.3 % (ref 43.0–77.0)
Platelets: 238 10*3/uL (ref 150.0–400.0)
RBC: 4.53 Mil/uL (ref 3.87–5.11)
RDW: 16.8 % — ABNORMAL HIGH (ref 11.5–15.5)
WBC: 8.5 10*3/uL (ref 4.0–10.5)

## 2023-10-13 LAB — COMPREHENSIVE METABOLIC PANEL
ALT: 23 U/L (ref 0–35)
AST: 29 U/L (ref 0–37)
Albumin: 4.4 g/dL (ref 3.5–5.2)
Alkaline Phosphatase: 63 U/L (ref 39–117)
BUN: 14 mg/dL (ref 6–23)
CO2: 30 meq/L (ref 19–32)
Calcium: 10.1 mg/dL (ref 8.4–10.5)
Chloride: 101 meq/L (ref 96–112)
Creatinine, Ser: 0.89 mg/dL (ref 0.40–1.20)
GFR: 65.8 mL/min (ref >60.00)
Glucose, Bld: 85 mg/dL (ref 70–99)
Potassium: 4.1 meq/L (ref 3.5–5.1)
Sodium: 140 meq/L (ref 135–145)
Total Bilirubin: 0.5 mg/dL (ref 0.2–1.2)
Total Protein: 7.8 g/dL (ref 6.0–8.3)

## 2023-10-13 LAB — LIPID PANEL
Cholesterol: 246 mg/dL — ABNORMAL HIGH (ref 0–200)
HDL: 85.6 mg/dL (ref >39.00)
LDL Cholesterol: 139 mg/dL — ABNORMAL HIGH (ref 0–99)
NonHDL: 160.52
Total CHOL/HDL Ratio: 3
Triglycerides: 108 mg/dL (ref 0.0–149.0)
VLDL: 21.6 mg/dL (ref 0.0–40.0)

## 2023-10-13 LAB — TSH: TSH: 2.54 u[IU]/mL (ref 0.35–5.50)

## 2023-10-13 LAB — VITAMIN D 25 HYDROXY (VIT D DEFICIENCY, FRACTURES): VITD: 45.33 ng/mL (ref 30.00–100.00)

## 2023-10-13 LAB — HEMOGLOBIN A1C: Hgb A1c MFr Bld: 6 % (ref 4.6–6.5)

## 2023-10-13 MED ORDER — ALBUTEROL SULFATE HFA 108 (90 BASE) MCG/ACT IN AERS: 2.0000 | INHALATION_SPRAY | Freq: Four times a day (QID) | RESPIRATORY_TRACT | 8 refills | Status: AC | PRN

## 2023-10-13 NOTE — Assessment & Plan Note (Signed)
Chronic Check a1c Low sugar / carb diet Stressed regular exercise  

## 2023-10-13 NOTE — Assessment & Plan Note (Signed)
Has cut down on alcohol use States she went 3 months without drinking at all and now is drinking a little bit.  She is trying to keep a check on this.

## 2023-10-13 NOTE — Assessment & Plan Note (Signed)
Chronic Symptoms controlled Has Flonase, over-the-counter antihistamine as needed

## 2023-10-13 NOTE — Assessment & Plan Note (Signed)
Chronic DEXA done by her gynecologist and is up-to-date Encouraged regular exercise Continue calcium and vitamin D daily Check vitamin D level

## 2023-10-13 NOTE — Assessment & Plan Note (Signed)
Chronic Controlled, stable CMP Continue hctz 12.5 daily

## 2023-10-13 NOTE — Assessment & Plan Note (Signed)
Chronic BMI 33 Stressed regular exercise Encouraged healthy diet, weight loss

## 2023-10-13 NOTE — Assessment & Plan Note (Signed)
Chronic Overall controlled Uses albuterol inhaler as needed

## 2023-10-13 NOTE — Assessment & Plan Note (Signed)
Chronic Regular exercise and healthy diet encouraged Check lipid panel, CMP, TSH Continue Zetia 10 mg daily and Crestor 20 mg daily

## 2023-10-13 NOTE — Assessment & Plan Note (Signed)
Chronic Taking vitamin D daily Check vitamin D level  

## 2023-10-14 ENCOUNTER — Other Ambulatory Visit: Payer: Self-pay | Admitting: Internal Medicine

## 2023-11-01 ENCOUNTER — Other Ambulatory Visit: Payer: Self-pay | Admitting: Internal Medicine

## 2023-11-10 ENCOUNTER — Other Ambulatory Visit: Payer: Self-pay | Admitting: Internal Medicine

## 2023-11-10 NOTE — Telephone Encounter (Signed)
Copied from CRM [id number]. Topic: Clinical - Medication Refill >> Nov 10, 2023 12:32 PM Denese Killings wrote: Most Recent Primary Care Visit:  Provider: Pincus Sanes  Department: [facility]  Visit Type: PHYSICAL  Date: 10/13/2023  Medication: ***  Has the patient contacted their pharmacy?  (Agent: If no, request that the patient contact the pharmacy for the refill. If patient does not wish to contact the pharmacy document the reason why and proceed with request.) (Agent: If yes, when and what did the pharmacy advise?)  Is this the correct pharmacy for this prescription?  If no, delete pharmacy and type the correct one.  This is the patient's preferred pharmacy:  [facility] [address] [place] - [address] [place] [address] Phone: [phone] Fax: [phone]   Has the prescription been filled recently?   Is the patient out of the medication?   Has the patient been seen for an appointment in the last year OR does the patient have an upcoming appointment?   Can we respond through MyChart?   Agent: Please be advised that Rx refills may take up to 3 business days. We ask that you follow-up with your pharmacy.

## 2023-11-14 MED ORDER — ROSUVASTATIN CALCIUM 20 MG PO TABS: ORAL_TABLET | ORAL | 2 refills | Status: DC

## 2023-11-30 ENCOUNTER — Ambulatory Visit
Admission: EM | Admit: 2023-11-30 | Discharge: 2023-11-30 | Disposition: A | Discharge status: Home / Self Care | Payer: 59 | Attending: Family Medicine | Admitting: Family Medicine

## 2023-11-30 DIAGNOSIS — J4521 Mild intermittent asthma with (acute) exacerbation: Secondary | ICD-10-CM

## 2023-11-30 DIAGNOSIS — J069 Acute upper respiratory infection, unspecified: Secondary | ICD-10-CM

## 2023-11-30 MED ORDER — PROMETHAZINE-DM 6.25-15 MG/5ML PO SYRP: 5.0000 mL | ORAL_SOLUTION | Freq: Four times a day (QID) | ORAL | 0 refills | Status: DC | PRN

## 2023-11-30 MED ORDER — PREDNISONE 20 MG PO TABS: 40.0000 mg | ORAL_TABLET | Freq: Every day | ORAL | 0 refills | Status: DC

## 2023-11-30 MED ORDER — GUAIFENESIN ER 600 MG PO TB12: 600.0000 mg | ORAL_TABLET | Freq: Two times a day (BID) | ORAL | 0 refills | Status: DC | PRN

## 2023-11-30 NOTE — Discharge Instructions (Signed)
 I have prescribed a short course of prednisone  to help with your symptoms as well as Mucinex  and a cough syrup.  Take your albuterol  inhaler every 4 hours as needed in addition to your other supportive over-the-counter medications as needed.  Return for significantly worsening symptoms.

## 2023-11-30 NOTE — ED Triage Notes (Signed)
"  This started Sunday with sore throat (like I had sand in my throat) this continues, lots of post nasal drip (colored mucous)". No fever. No sob. No wheezing. Coughing "at night mostly". No fever.

## 2023-12-01 ENCOUNTER — Ambulatory Visit
Admission: RE | Admit: 2023-12-01 | Discharge: 2023-12-01 | Disposition: A | Discharge status: Home / Self Care | Payer: 59 | Source: Ambulatory Visit | Attending: Internal Medicine

## 2023-12-01 ENCOUNTER — Ambulatory Visit: Payer: 59

## 2023-12-01 DIAGNOSIS — Z1231 Encounter for screening mammogram for malignant neoplasm of breast: Secondary | ICD-10-CM

## 2023-12-02 ENCOUNTER — Other Ambulatory Visit: Payer: Self-pay | Admitting: Internal Medicine

## 2023-12-02 NOTE — ED Provider Notes (Signed)
 [facility]    CSN: [id number] Arrival date & time: 11/30/23  1419      History   Chief Complaint Chief Complaint  Patient presents with   Sore Throat   Nasal Congestion    HPI Sandra [name] is a 73 y.o. female.   Patient presenting today with sore throat, postnasal drainage, cough with occasional chest tightness and wheezing for the past 4 days or so.  Denies fever, chills, chest pain, shortness of breath, abdominal pain, nausea vomiting or diarrhea.  So far trying over-the-counter remedies with minimal relief.  History of asthma on albuterol  as needed.    Past Medical History:  Diagnosis Date   Hyperlipidemia    Osteopenia    ??    Patient Active Problem List   Diagnosis Date Noted   Asthma 11/08/2021   Allergic rhinitis 09/06/2019   History of alcohol abuse 03/26/2019   Genital herpes 03/23/2018   Prediabetes 03/25/2017   Obesity (BMI 30-39.9) 05/02/2014   Leg swelling 09/16/2013   Vitamin D  deficiency 03/01/2013   Osteopenia 03/30/2010   Hyperlipidemia 03/17/2008    Past Surgical History:  Procedure Laterality Date   CATARACT EXTRACTION Bilateral    COLONOSCOPY     Dr Hercules   DILATION AND CURETTAGE OF UTERUS     [id number]     WISDOM TOOTH EXTRACTION      OB History   No obstetric history on file.      Home Medications    Prior to Admission medications   Medication Sig Start Date End Date Taking? Authorizing Provider  albuterol  (VENTOLIN  HFA) 108 (90 Base) MCG/ACT inhaler Inhale 2 puffs into the lungs every 6 (six) hours as needed for wheezing or shortness of breath. 10/13/23  Yes Burns, Glade PARAS, MD  Calcium  Carbonate-Vitamin D  600-400 MG-UNIT tablet Take 1 tablet by mouth 2 (two) times daily.   Yes [provider]  Calcium  Citrate-Vitamin D  (CITRACAL + D PO) Citracal   Yes [provider]  ezetimibe  (ZETIA ) 10 MG tablet TAKE 1 TABLET BY MOUTH EVERY DAY 10/16/23  Yes Burns, Glade PARAS, MD  guaiFENesin  (MUCINEX ) 600 MG 12 hr  tablet Take 1 tablet (600 mg total) by mouth 2 (two) times daily as needed. 11/30/23  Yes Stuart Niyana Norris, PA-C  hydrochlorothiazide  (MICROZIDE ) 12.5 MG capsule TAKE 1 CAPSULE (12.5 MG TOTAL) BY MOUTH DAILY. ANNUAL APPT DUE IN SEPT MUST SEE PROVIDER FOR FUTURE REFILLS 11/01/23  Yes Geofm Glade PARAS, MD  predniSONE  (DELTASONE ) 20 MG tablet Take 2 tablets (40 mg total) by mouth daily with breakfast. 11/30/23  Yes Stuart Rema Norris, PA-C  promethazine -dextromethorphan (PROMETHAZINE -DM) 6.25-15 MG/5ML syrup Take 5 mLs by mouth 4 (four) times daily as needed for cough. 11/30/23  Yes Stuart Jakara Norris, PA-C  rosuvastatin  (CRESTOR ) 20 MG tablet TAKE 1 TABLET BY MOUTH EVERY DAY 11/14/23  Yes Burns, Glade PARAS, MD  fluticasone  (FLONASE ) 50 MCG/ACT nasal spray Place 2 sprays into both nostrils daily. 12/14/21   Geofm Glade PARAS, MD  ketorolac (ACULAR) 0.5 % ophthalmic solution Place 1 drop into the left eye 3 (three) times daily. 03/30/20   [provider]  levocetirizine (XYZAL ) 5 MG tablet Take 1 tablet (5 mg total) by mouth every evening. 12/23/21   Geofm Glade PARAS, MD  triamcinolone ointment (KENALOG) 0.1 % Apply topically 2 (two) times daily. 05/02/22   [provider]  valACYclovir  (VALTREX ) 1000 MG tablet Take by mouth. 05/02/22   [provider]  valACYclovir  (VALTREX )  500 MG tablet Take 1 tablet (500 mg total) by mouth 2 (two) times daily. Take for 3 days for outbreak 07/13/21   Geofm Glade PARAS, MD    Family History Family History  Problem Relation Age of Onset   Heart disease Mother        irreg heart rate & heart failure   Stroke Father        Alsheimer's   Hyperlipidemia Sister    Glaucoma Sister    Hyperlipidemia Brother    Heart attack Maternal Grandmother        < 65   Glaucoma Maternal Aunt    Macular degeneration Maternal Aunt    Hypertension Maternal Aunt    Asthma Maternal Aunt    Cancer Paternal Aunt        X 2 had breast cncer; 1 aunt cervical cancer    Diabetes Other        mat. & pat sides   Hypertension Other        mat & pat sides    Social History Social History   Tobacco Use   Smoking status: Never    Passive exposure: Never   Smokeless tobacco: Never  Vaping Use   Vaping status: Never Used  Substance Use Topics   Alcohol use: Yes    Comment: 4-6 beers a day   Drug use: No     Allergies   Patient has no known allergies.   Review of Systems Review of Systems Per HPI  Physical Exam Triage Vital Signs ED Triage Vitals  Encounter Vitals Group     BP 11/30/23 1436 118/77     Systolic BP Percentile --      Diastolic BP Percentile --      Pulse Rate 11/30/23 1436 (!) 105     Resp 11/30/23 1436 18     Temp 11/30/23 1436 98.1 F (36.7 C)     Temp Source 11/30/23 1436 Oral     SpO2 11/30/23 1436 98 %     Weight 11/30/23 1434 181 lb (82.1 kg)     Height 11/30/23 1434 5' (1.524 m)     Head Circumference --      Peak Flow --      Pain Score 11/30/23 1423 3     Pain Loc --      Pain Education --      Exclude from Growth Chart --    No data found.  Updated Vital Signs BP 118/77 (BP Location: Left Arm)   Pulse (!) 103   Temp 98.1 F (36.7 C) (Oral)   Resp 18   Ht 5' (1.524 m)   Wt 181 lb (82.1 kg)   SpO2 97%   BMI 35.35 kg/m   Visual Acuity Right Eye Distance:   Left Eye Distance:   Bilateral Distance:    Right Eye Near:   Left Eye Near:    Bilateral Near:     Physical Exam Vitals and nursing note reviewed.  Constitutional:      Appearance: Normal appearance.  HENT:     Head: Atraumatic.     Right Ear: Tympanic membrane and external ear normal.     Left Ear: Tympanic membrane and external ear normal.     Nose: Rhinorrhea present.     Mouth/Throat:     Mouth: Mucous membranes are moist.     Pharynx: Posterior oropharyngeal erythema present.  Eyes:     Extraocular Movements: Extraocular movements intact.  Conjunctiva/sclera: Conjunctivae normal.  Cardiovascular:     Rate and Rhythm:  Normal rate and regular rhythm.     Heart sounds: Normal heart sounds.  Pulmonary:     Effort: Pulmonary effort is normal.     Breath sounds: Wheezing present. No rales.  Musculoskeletal:        General: Normal range of motion.     Cervical back: Normal range of motion and neck supple.  Skin:    General: Skin is warm and dry.  Neurological:     Mental Status: She is alert and oriented to person, place, and time.  Psychiatric:        Mood and Affect: Mood normal.        Thought Content: Thought content normal.      UC Treatments / Results  Labs (all labs ordered are listed, but only abnormal results are displayed) Labs Reviewed - No data to display  EKG   Radiology No results found.  Procedures Procedures (including critical care time)  Medications Ordered in UC Medications - No data to display  Initial Impression / Assessment and Plan / UC Course  I have reviewed the triage vital signs and the nursing notes.  Pertinent labs & imaging results that were available during my care of the patient were reviewed by me and considered in my medical decision making (see chart for details).     Minimally tachycardic in triage, otherwise vital signs within normal limits.  Suspect viral respiratory infection causing asthma exacerbation.  Treat with prednisone , albuterol  as needed, Phenergan  DM, Mucinex .  Supportive home care and return precautions reviewed.  Final Clinical Impressions(s) / UC Diagnoses   Final diagnoses:  Viral URI with cough  Mild intermittent asthma with acute exacerbation     Discharge Instructions      I have prescribed a short course of prednisone  to help with your symptoms as well as Mucinex  and a cough syrup.  Take your albuterol  inhaler every 4 hours as needed in addition to your other supportive over-the-counter medications as needed.  Return for significantly worsening symptoms.    ED Prescriptions     Medication Sig Dispense Auth. Provider    promethazine -dextromethorphan (PROMETHAZINE -DM) 6.25-15 MG/5ML syrup Take 5 mLs by mouth 4 (four) times daily as needed for cough. 118 mL Stuart Jasmyn Norris, PA-C   guaiFENesin  (MUCINEX ) 600 MG 12 hr tablet Take 1 tablet (600 mg total) by mouth 2 (two) times daily as needed. 20 tablet Stuart Hazeline Norris, PA-C   predniSONE  (DELTASONE ) 20 MG tablet Take 2 tablets (40 mg total) by mouth daily with breakfast. 6 tablet Stuart Jaskiran Norris, [place]      PDMP not reviewed this encounter.   Stuart Sydney Norris, [place] 12/02/23 1549

## 2023-12-06 ENCOUNTER — Ambulatory Visit: Payer: 59 | Admitting: Internal Medicine

## 2023-12-06 ENCOUNTER — Encounter: Payer: Self-pay | Admitting: Internal Medicine

## 2023-12-06 VITALS — BP 118/80 | HR 82 | Temp 98.4°F | Ht 60.0 in | Wt 187.0 lb

## 2023-12-06 DIAGNOSIS — J069 Acute upper respiratory infection, unspecified: Secondary | ICD-10-CM | POA: Diagnosis not present

## 2023-12-06 DIAGNOSIS — J4531 Mild persistent asthma with (acute) exacerbation: Secondary | ICD-10-CM | POA: Diagnosis not present

## 2023-12-06 DIAGNOSIS — H6991 Unspecified Eustachian tube disorder, right ear: Secondary | ICD-10-CM

## 2023-12-06 NOTE — Progress Notes (Signed)
 Subjective:    Patient ID: Sandra [name], female    DOB: [birth year], 73 y.o.   MRN: [id number]      HPI [name] is here for  Chief Complaint  Patient presents with   Cough    Went to Urgent care last week     She went to urgent care 1/9 and was diagnosed with a viral URI, mild intermittent asthma with acute exacerbation.  She was prescribed prednisone  40 mg daily, promethazine  cough syrup and Mucinex .   Symptoms started 1/4.  She had a productive cough of yellow mucus and three times she has blood in the mucus from coughing too hard.   Symptoms are better but still coughing.  She has seen one spec of blood since urgent care.  She is using her inhaler.   Her right ear is still clogged - it is not opening up.   Using flonase  but not daily.  Using saline nasal spray and vaseline in dose.   Medications and allergies reviewed with patient and updated if appropriate.  Current Outpatient Medications on File Prior to Visit  Medication Sig Dispense Refill   albuterol  (VENTOLIN  HFA) 108 (90 Base) MCG/ACT inhaler Inhale 2 puffs into the lungs every 6 (six) hours as needed for wheezing or shortness of breath. 6.7 g 8   Calcium  Carbonate-Vitamin D  600-400 MG-UNIT tablet Take 1 tablet by mouth 2 (two) times daily.     Calcium  Citrate-Vitamin D  (CITRACAL + D PO) Citracal     ezetimibe  (ZETIA ) 10 MG tablet TAKE 1 TABLET BY MOUTH EVERY DAY 30 tablet 2   fluticasone  (FLONASE ) 50 MCG/ACT nasal spray Place 2 sprays into both nostrils daily. 16 g 6   guaiFENesin  (MUCINEX ) 600 MG 12 hr tablet Take 1 tablet (600 mg total) by mouth 2 (two) times daily as needed. 20 tablet 0   hydrochlorothiazide  (MICROZIDE ) 12.5 MG capsule Take 1 capsule (12.5 mg total) by mouth daily. 30 capsule 2   ketorolac (ACULAR) 0.5 % ophthalmic solution Place 1 drop into the left eye 3 (three) times daily.     levocetirizine (XYZAL ) 5 MG tablet Take 1 tablet (5 mg total) by mouth every evening. 30 tablet 0    promethazine -dextromethorphan (PROMETHAZINE -DM) 6.25-15 MG/5ML syrup Take 5 mLs by mouth 4 (four) times daily as needed for cough. 118 mL 0   rosuvastatin  (CRESTOR ) 20 MG tablet TAKE 1 TABLET BY MOUTH EVERY DAY 30 tablet 2   triamcinolone ointment (KENALOG) 0.1 % Apply topically 2 (two) times daily.     valACYclovir  (VALTREX ) 1000 MG tablet Take by mouth.     valACYclovir  (VALTREX ) 500 MG tablet Take 1 tablet (500 mg total) by mouth 2 (two) times daily. Take for 3 days for outbreak 30 tablet 2   No current facility-administered medications on file prior to visit.    Review of Systems  Constitutional:  Negative for fever.  HENT:  Positive for congestion and sore throat (first couple of days). Negative for ear pain (clogged), postnasal drip, sinus pressure and sinus pain.        Nostril dry  Respiratory:  Positive for cough (productive of yellow), shortness of breath (mild) and wheezing (mild).   Neurological:  Negative for light-headedness and headaches.       Objective:   Vitals:   12/06/23 1356  BP: 118/80  Pulse: 82  Temp: 98.4 F (36.9 C)  SpO2: 96%   BP Readings from Last 3 Encounters:  12/06/23 118/80  11/30/23 118/77  10/13/23 110/72   Wt Readings from Last 3 Encounters:  12/06/23 187 lb (84.8 kg)  11/30/23 181 lb (82.1 kg)  10/13/23 181 lb (82.1 kg)   Body mass index is 36.52 kg/m.    Physical Exam Constitutional:      General: She is not in acute distress.    Appearance: Normal appearance. She is not ill-appearing.  HENT:     Head: Normocephalic and atraumatic.     Right Ear: Ear canal and external ear normal.     Left Ear: Tympanic membrane, ear canal and external ear normal.     Ears:     Comments: Right TM with effusion - no erythema    Mouth/Throat:     Mouth: Mucous membranes are moist.     Pharynx: No oropharyngeal exudate or posterior oropharyngeal erythema.  Eyes:     Conjunctiva/sclera: Conjunctivae normal.  Cardiovascular:     Rate and  Rhythm: Normal rate and regular rhythm.  Pulmonary:     Effort: Pulmonary effort is normal. No respiratory distress.     Breath sounds: Normal breath sounds. No wheezing or rales.  Musculoskeletal:     Cervical back: Neck supple. No tenderness.  Lymphadenopathy:     Cervical: No cervical adenopathy.  Skin:    General: Skin is warm and dry.  Neurological:     Mental Status: She is alert.            Assessment & Plan:    See Problem List for Assessment and Plan of chronic medical problems.

## 2023-12-06 NOTE — Assessment & Plan Note (Signed)
 Acute Related to acute URI Discussed that she should start Flonase  daily with saline nasal spray and vaseline night to prevent bleeding If no improvement she will let me know and I will prescribe steroids

## 2023-12-06 NOTE — Assessment & Plan Note (Signed)
 Acute Related to URI Improving Expect symptoms to continue to improve Continue symptomatic treatment and inhalers

## 2023-12-06 NOTE — Assessment & Plan Note (Signed)
 Acute Improving Expect symptoms to continue to improve Continue symptomatic treatment

## 2023-12-06 NOTE — Patient Instructions (Addendum)
 Medications changes include :   None     Return if symptoms worsen or fail to improve.   Eustachian Tube Dysfunction  Eustachian tube dysfunction refers to a condition in which a blockage develops in the narrow passage that connects the middle ear to the back of the nose (eustachian tube). The eustachian tube regulates air pressure in the middle ear by letting air move between the ear and nose. It also helps to drain fluid from the middle ear space. Eustachian tube dysfunction can affect one or both ears. When the eustachian tube does not function properly, air pressure, fluid, or both can build up in the middle ear. What are the causes? This condition occurs when the eustachian tube becomes blocked or cannot open normally. Common causes of this condition include: Ear infections. Colds and other infections that affect the nose, mouth, and throat (upper respiratory tract). Allergies. Irritation from cigarette smoke. Irritation from stomach acid coming up into the esophagus (gastroesophageal reflux). The esophagus is the part of the body that moves food from the mouth to the stomach. Sudden changes in air pressure, such as from descending in an airplane or scuba diving. Abnormal growths in the nose or throat, such as: Growths that line the nose (nasal polyps). Abnormal growth of cells (tumors). Enlarged tissue at the back of the throat (adenoids). What increases the risk? You are more likely to develop this condition if: You smoke. You are overweight. You are a child who has: Certain birth defects of the mouth, such as cleft palate. Large tonsils or adenoids. What are the signs or symptoms? Common symptoms of this condition include: A feeling of fullness in the ear. Ear pain. Clicking or popping noises in the ear. Ringing in the ear (tinnitus). Hearing loss. Loss of balance. Dizziness. Symptoms may get worse when the air pressure around you changes, such as when you  travel to an area of high elevation, fly on an airplane, or go scuba diving. How is this diagnosed? This condition may be diagnosed based on: Your symptoms. A physical exam of your ears, nose, and throat. Tests, such as those that measure: The movement of your eardrum. Your hearing (audiometry). How is this treated? Treatment depends on the cause and severity of your condition. In mild cases, you may relieve your symptoms by moving air into your ears. This is called "popping the ears." In more severe cases, or if you have symptoms of fluid in your ears, treatment may include: Medicines to relieve congestion (decongestants). Medicines that treat allergies (antihistamines). Nasal sprays or ear drops that contain medicines that reduce swelling (steroids). A procedure to drain the fluid in your eardrum. In this procedure, a small tube may be placed in the eardrum to: Drain the fluid. Restore the air in the middle ear space. A procedure to insert a balloon device through the nose to inflate the opening of the eustachian tube (balloon dilation). Follow these instructions at home: Lifestyle Do not do any of the following until your health care provider approves: Travel to high altitudes. Fly in airplanes. Work in a Estate agent or room. Scuba dive. Do not use any products that contain nicotine or tobacco. These products include cigarettes, chewing tobacco, and vaping devices, such as e-cigarettes. If you need help quitting, ask your health care provider. Keep your ears dry. Wear fitted earplugs during showering and bathing. Dry your ears completely after. General instructions Take over-the-counter and prescription medicines only as told by your  health care provider. Use techniques to help pop your ears as recommended by your health care provider. These may include: Chewing gum. Yawning. Frequent, forceful swallowing. Closing your mouth, holding your nose closed, and gently blowing as  if you are trying to blow air out of your nose. Keep all follow-up visits. This is important. Contact a health care provider if: Your symptoms do not go away after treatment. Your symptoms come back after treatment. You are unable to pop your ears. You have: A fever. Pain in your ear. Pain in your head or neck. Fluid draining from your ear. Your hearing suddenly changes. You become very dizzy. You lose your balance. Get help right away if: You have a sudden, severe increase in any of your symptoms. Summary Eustachian tube dysfunction refers to a condition in which a blockage develops in the eustachian tube. It can be caused by ear infections, allergies, inhaled irritants, or abnormal growths in the nose or throat. Symptoms may include ear pain or fullness, hearing loss, or ringing in the ears. Mild cases are treated with techniques to unblock the ears, such as yawning or chewing gum. More severe cases are treated with medicines or procedures. This information is not intended to replace advice given to you by your health care provider. Make sure you discuss any questions you have with your health care provider. Document Revised: 01/18/2021 Document Reviewed: 01/18/2021 Elsevier Patient Education  2024 [employer].

## 2023-12-11 ENCOUNTER — Telehealth: Payer: Self-pay | Admitting: Internal Medicine

## 2023-12-11 MED ORDER — PREDNISONE 20 MG PO TABS: 40.0000 mg | ORAL_TABLET | Freq: Every day | ORAL | 0 refills | Status: AC

## 2023-12-11 NOTE — Telephone Encounter (Signed)
Copied from CRM [id number]. Topic: Clinical - Medical Advice >> Dec 11, 2023  9:46 AM Almira Coaster wrote: Reason for CRM: Patient was seen on 12/06/2023 and started using flonase as instructed by Dr.Burns; however, patient is calling to advise that symptoms have not improved and would like to start taking the steroids Dr.Burns recommended.

## 2023-12-11 NOTE — Addendum Note (Signed)
Addended by: Pincus Sanes on: 12/11/2023 12:13 PM   Modules accepted: Orders

## 2023-12-11 NOTE — Telephone Encounter (Signed)
Per DPR can LM, LM for pt advising of rx sent

## 2023-12-11 NOTE — Telephone Encounter (Signed)
Prescription sent to pharmacy.

## 2023-12-11 NOTE — Telephone Encounter (Signed)
Flonase has not helped and reports you recommended steroid

## 2023-12-13 ENCOUNTER — Other Ambulatory Visit: Payer: Self-pay | Admitting: Internal Medicine

## 2024-01-24 ENCOUNTER — Other Ambulatory Visit: Payer: Self-pay | Admitting: Internal Medicine

## 2024-02-12 LAB — HM DIABETES EYE EXAM

## 2024-02-28 ENCOUNTER — Other Ambulatory Visit: Payer: Self-pay | Admitting: Internal Medicine

## 2024-03-23 ENCOUNTER — Other Ambulatory Visit: Payer: Self-pay | Admitting: Internal Medicine

## 2024-04-12 ENCOUNTER — Ambulatory Visit: Payer: 59 | Admitting: Internal Medicine

## 2024-04-25 ENCOUNTER — Other Ambulatory Visit: Payer: Self-pay | Admitting: Internal Medicine

## 2024-05-01 ENCOUNTER — Encounter: Payer: Self-pay | Admitting: Internal Medicine

## 2024-05-01 ENCOUNTER — Ambulatory Visit: Admitting: Internal Medicine

## 2024-05-01 VITALS — BP 120/80 | HR 76 | Temp 98.6°F | Ht 60.0 in | Wt 187.0 lb

## 2024-05-01 DIAGNOSIS — E669 Obesity, unspecified: Secondary | ICD-10-CM

## 2024-05-01 DIAGNOSIS — J309 Allergic rhinitis, unspecified: Secondary | ICD-10-CM

## 2024-05-01 DIAGNOSIS — J4520 Mild intermittent asthma, uncomplicated: Secondary | ICD-10-CM

## 2024-05-01 DIAGNOSIS — R7303 Prediabetes: Secondary | ICD-10-CM

## 2024-05-01 DIAGNOSIS — M7989 Other specified soft tissue disorders: Secondary | ICD-10-CM

## 2024-05-01 DIAGNOSIS — E782 Mixed hyperlipidemia: Secondary | ICD-10-CM

## 2024-05-01 DIAGNOSIS — Z6833 Body mass index (BMI) 33.0-33.9, adult: Secondary | ICD-10-CM

## 2024-05-01 NOTE — Assessment & Plan Note (Signed)
 Chronic Lab Results  Component Value Date   HGBA1C 6.0 10/13/2023    Check a1c Low sugar / carb diet Stressed regular exercise

## 2024-05-01 NOTE — Patient Instructions (Addendum)
      Blood work was ordered.       Medications changes include :   None      Return in about 6 months (around 10/31/2024) for Physical Exam.

## 2024-05-01 NOTE — Assessment & Plan Note (Signed)
 Chronic Controlled, stable CMP Continue hctz 12.5 daily

## 2024-05-01 NOTE — Progress Notes (Signed)
 Subjective:    Patient ID: Sandra [name], female    DOB: [birth year], 72 y.o.   MRN: [id number]     HPI Sandra [name] is here for follow up of her chronic medical problems.  Doing well-no changes.  No concerns.  Medications and allergies reviewed with patient and updated if appropriate.  Current Outpatient Medications on File Prior to Visit  Medication Sig Dispense Refill   albuterol  (VENTOLIN  HFA) 108 (90 Base) MCG/ACT inhaler Inhale 2 puffs into the lungs every 6 (six) hours as needed for wheezing or shortness of breath. 6.7 g 8   Calcium  Carbonate-Vitamin D  600-400 MG-UNIT tablet Take 1 tablet by mouth 2 (two) times daily.     Calcium  Citrate-Vitamin D  (CITRACAL + D PO) Citracal     ezetimibe  (ZETIA ) 10 MG tablet TAKE 1 TABLET BY MOUTH EVERY DAY 30 tablet 2   fluticasone  (FLONASE ) 50 MCG/ACT nasal spray Place 2 sprays into both nostrils daily. 16 g 6   guaiFENesin  (MUCINEX ) 600 MG 12 hr tablet Take 1 tablet (600 mg total) by mouth 2 (two) times daily as needed. 20 tablet 0   hydrochlorothiazide  (MICROZIDE ) 12.5 MG capsule TAKE 1 CAPSULE BY MOUTH EVERY DAY 30 capsule 2   ketorolac (ACULAR) 0.5 % ophthalmic solution Place 1 drop into the left eye 3 (three) times daily.     levocetirizine (XYZAL ) 5 MG tablet Take 1 tablet (5 mg total) by mouth every evening. 30 tablet 0   rosuvastatin  (CRESTOR ) 20 MG tablet TAKE 1 TABLET BY MOUTH EVERY DAY 30 tablet 2   triamcinolone ointment (KENALOG) 0.1 % Apply topically 2 (two) times daily.     valACYclovir  (VALTREX ) 1000 MG tablet Take by mouth.     valACYclovir  (VALTREX ) 500 MG tablet Take 1 tablet (500 mg total) by mouth 2 (two) times daily. Take for 3 days for outbreak 30 tablet 2   No current facility-administered medications on file prior to visit.     Review of Systems  Constitutional:  Negative for fever.  Respiratory:  Negative for cough, shortness of breath and wheezing.   Cardiovascular:  Positive for leg swelling (controlled).  Negative for chest pain and palpitations.  Neurological:  Negative for light-headedness and headaches.       Objective:   Vitals:   05/01/24 1446  BP: 120/80  Pulse: 76  Temp: 98.6 F (37 C)  SpO2: 98%   BP Readings from Last 3 Encounters:  05/01/24 120/80  12/06/23 118/80  11/30/23 118/77   Wt Readings from Last 3 Encounters:  05/01/24 187 lb (84.8 kg)  12/06/23 187 lb (84.8 kg)  11/30/23 181 lb (82.1 kg)   Body mass index is 36.52 kg/m.    Physical Exam Constitutional:      General: She is not in acute distress.    Appearance: Normal appearance.  HENT:     Head: Normocephalic and atraumatic.  Eyes:     Conjunctiva/sclera: Conjunctivae normal.  Cardiovascular:     Rate and Rhythm: Normal rate and regular rhythm.     Heart sounds: Normal heart sounds.  Pulmonary:     Effort: Pulmonary effort is normal. No respiratory distress.     Breath sounds: Normal breath sounds. No wheezing.  Musculoskeletal:     Cervical back: Neck supple.     Right lower leg: No edema.     Left lower leg: No edema.  Lymphadenopathy:     Cervical: No cervical adenopathy.  Skin:    General:  Skin is warm and dry.     Findings: No rash.  Neurological:     Mental Status: She is alert. Mental status is at baseline.  Psychiatric:        Mood and Affect: Mood normal.        Behavior: Behavior normal.        Lab Results  Component Value Date   WBC 8.5 10/13/2023   HGB 14.0 10/13/2023   HCT 43.1 10/13/2023   PLT 238.0 10/13/2023   GLUCOSE 85 10/13/2023   CHOL 246 (H) 10/13/2023   TRIG 108.0 10/13/2023   HDL 85.60 10/13/2023   LDLDIRECT 120.0 06/28/2019   LDLCALC 139 (H) 10/13/2023   ALT 23 10/13/2023   AST 29 10/13/2023   NA 140 10/13/2023   K 4.1 10/13/2023   CL 101 10/13/2023   CREATININE 0.89 10/13/2023   BUN 14 10/13/2023   CO2 30 10/13/2023   TSH 2.54 10/13/2023   INR 1.08 06/05/2016   HGBA1C 6.0 10/13/2023     Assessment & Plan:    See Problem List for  Assessment and Plan of chronic medical problems.

## 2024-05-01 NOTE — Assessment & Plan Note (Signed)
 Chronic Regular exercise and healthy diet encouraged Check lipid panel, CMP, TSH Continue Zetia 10 mg daily and Crestor 20 mg daily

## 2024-05-01 NOTE — Assessment & Plan Note (Signed)
 Chronic Overall controlled Uses albuterol  inhaler as needed - has not needed to use it recently

## 2024-05-01 NOTE — Assessment & Plan Note (Signed)
 Chronic Symptoms controlled Has Flonase, over-the-counter antihistamine as needed

## 2024-05-01 NOTE — Assessment & Plan Note (Signed)
 Chronic BMI 33 Stressed regular exercise Encouraged healthy diet, weight loss

## 2024-05-02 ENCOUNTER — Ambulatory Visit: Payer: Self-pay | Admitting: Internal Medicine

## 2024-05-02 LAB — LIPID PANEL
Cholesterol: 216 mg/dL — ABNORMAL HIGH (ref <200)
HDL: 85 mg/dL (ref >=50)
LDL Cholesterol (Calc): 112 mg/dL — ABNORMAL HIGH
Non-HDL Cholesterol (Calc): 131 mg/dL — ABNORMAL HIGH (ref <130)
Total CHOL/HDL Ratio: 2.5 (calc) (ref <5.0)
Triglycerides: 90 mg/dL (ref <150)

## 2024-05-02 LAB — COMPREHENSIVE METABOLIC PANEL WITH GFR
AG Ratio: 1.5 (calc) (ref 1.0–2.5)
ALT: 15 U/L (ref 6–29)
AST: 18 U/L (ref 10–35)
Albumin: 4.3 g/dL (ref 3.6–5.1)
Alkaline phosphatase (APISO): 61 U/L (ref 37–153)
BUN: 12 mg/dL (ref 7–25)
CO2: 27 mmol/L (ref 20–32)
Calcium: 9.7 mg/dL (ref 8.6–10.4)
Chloride: 102 mmol/L (ref 98–110)
Creat: 0.93 mg/dL (ref 0.60–1.00)
Globulin: 2.9 g/dL (ref 1.9–3.7)
Glucose, Bld: 91 mg/dL (ref 65–99)
Potassium: 3.9 mmol/L (ref 3.5–5.3)
Sodium: 142 mmol/L (ref 135–146)
Total Bilirubin: 0.5 mg/dL (ref 0.2–1.2)
Total Protein: 7.2 g/dL (ref 6.1–8.1)
eGFR: 66 mL/min/{1.73_m2} (ref >=60)

## 2024-05-02 LAB — CBC WITH DIFFERENTIAL/PLATELET
Absolute Lymphocytes: 2444 {cells}/uL (ref 850–3900)
Absolute Monocytes: 949 {cells}/uL (ref 200–950)
Basophils Absolute: 101 {cells}/uL (ref 0–200)
Basophils Relative: 1 %
Eosinophils Absolute: 222 {cells}/uL (ref 15–500)
Eosinophils Relative: 2.2 %
HCT: 42.2 % (ref 35.0–45.0)
Hemoglobin: 13.5 g/dL (ref 11.7–15.5)
MCH: 30.6 pg (ref 27.0–33.0)
MCHC: 32 g/dL (ref 32.0–36.0)
MCV: 95.7 fL (ref 80.0–100.0)
MPV: 11.5 fL (ref 7.5–12.5)
Monocytes Relative: 9.4 %
Neutro Abs: 6383 {cells}/uL (ref 1500–7800)
Neutrophils Relative %: 63.2 %
Platelets: 247 10*3/uL (ref 140–400)
RBC: 4.41 10*6/uL (ref 3.80–5.10)
RDW: 13.4 % (ref 11.0–15.0)
Total Lymphocyte: 24.2 %
WBC: 10.1 10*3/uL (ref 3.8–10.8)

## 2024-05-02 LAB — HEMOGLOBIN A1C
Hgb A1c MFr Bld: 6.3 % — ABNORMAL HIGH (ref <5.7)
Mean Plasma Glucose: 134 mg/dL
eAG (mmol/L): 7.4 mmol/L

## 2024-06-17 ENCOUNTER — Other Ambulatory Visit: Payer: Self-pay | Admitting: Internal Medicine

## 2024-06-17 MED ORDER — HYDROCHLOROTHIAZIDE 12.5 MG PO CAPS: 12.5000 mg | ORAL_CAPSULE | Freq: Every day | ORAL | 4 refills | Status: DC

## 2024-06-17 NOTE — Telephone Encounter (Signed)
 Copied from CRM [id number]. Topic: Clinical - Medication Refill >> Jun 17, 2024  1:19 PM Taleah C wrote: Medication: hydrochlorothiazide    Has the patient contacted their pharmacy? Yes No more refills   This is the patient's preferred pharmacy:  [id number] [address], [place] - [address] [place] [address] Phone: [phone] Fax: [phone]  Is this the correct pharmacy for this prescription? Yes If no, delete pharmacy and type the correct one.   Has the prescription been filled recently? Yes  Is the patient out of the medication? No  Has the patient been seen for an appointment in the last year OR does the patient have an upcoming appointment? Yes  Can we respond through MyChart? Yes  Agent: Please be advised that Rx refills may take up to 3 business days. We ask that you follow-up with your pharmacy.

## 2024-07-02 ENCOUNTER — Other Ambulatory Visit: Payer: Self-pay | Admitting: Internal Medicine

## 2024-07-02 MED ORDER — EZETIMIBE 10 MG PO TABS
10.0000 mg | ORAL_TABLET | Freq: Every day | ORAL | 2 refills | Status: DC
Start: 2024-07-02 — End: 2024-09-30

## 2024-07-02 NOTE — Telephone Encounter (Signed)
 Copied from CRM [id number]. Topic: Clinical - Medication Refill >> Jul 02, 2024 12:31 PM Deleta S wrote: Medication: ezetimibe  (ZETIA ) 10 MG tablet  Has the patient contacted their pharmacy? Yes (Agent: If no, request that the patient contact the pharmacy for the refill. If patient does not wish to contact the pharmacy document the reason why and proceed with request.) (Agent: If yes, when and what did the pharmacy advise?)  This is the patient's preferred pharmacy:  [id number] [place], [place] - [address] [place] [address] Phone: [phone] Fax: [phone]  Is this the correct pharmacy for this prescription? Yes If no, delete pharmacy and type the correct one.   Has the prescription been filled recently? Yes  Is the patient out of the medication? No, 3 remaining  Has the patient been seen for an appointment in the last year OR does the patient have an upcoming appointment? Yes  Can we respond through MyChart? Yes  Agent: Please be advised that Rx refills may take up to 3 business days. We ask that you follow-up with your pharmacy.

## 2024-07-25 ENCOUNTER — Other Ambulatory Visit: Payer: Self-pay | Admitting: Internal Medicine

## 2024-09-29 ENCOUNTER — Other Ambulatory Visit: Payer: Self-pay | Admitting: Internal Medicine

## 2024-10-23 ENCOUNTER — Other Ambulatory Visit: Payer: Self-pay | Admitting: Internal Medicine

## 2024-10-23 DIAGNOSIS — Z1231 Encounter for screening mammogram for malignant neoplasm of breast: Secondary | ICD-10-CM

## 2024-10-31 ENCOUNTER — Other Ambulatory Visit: Payer: Self-pay | Admitting: Internal Medicine

## 2024-11-10 ENCOUNTER — Other Ambulatory Visit: Payer: Self-pay | Admitting: Internal Medicine

## 2024-12-02 ENCOUNTER — Ambulatory Visit
Admission: RE | Admit: 2024-12-02 | Discharge: 2024-12-02 | Disposition: A | Source: Ambulatory Visit | Attending: Internal Medicine

## 2024-12-02 DIAGNOSIS — Z1231 Encounter for screening mammogram for malignant neoplasm of breast: Secondary | ICD-10-CM

## 2024-12-14 ENCOUNTER — Ambulatory Visit (INDEPENDENT_AMBULATORY_CARE_PROVIDER_SITE_OTHER)

## 2024-12-14 ENCOUNTER — Encounter: Payer: Self-pay | Admitting: *Deleted

## 2024-12-14 ENCOUNTER — Ambulatory Visit: Admission: EM | Admit: 2024-12-14 | Discharge: 2024-12-14 | Disposition: A | Discharge status: Home / Self Care

## 2024-12-14 DIAGNOSIS — R059 Cough, unspecified: Secondary | ICD-10-CM

## 2024-12-14 DIAGNOSIS — J209 Acute bronchitis, unspecified: Secondary | ICD-10-CM

## 2024-12-14 MED ORDER — AZITHROMYCIN 250 MG PO TABS: 250.0000 mg | ORAL_TABLET | Freq: Every day | ORAL | 0 refills | Status: DC

## 2024-12-14 MED ORDER — BENZONATATE 100 MG PO CAPS: 100.0000 mg | ORAL_CAPSULE | Freq: Three times a day (TID) | ORAL | 0 refills | Status: AC

## 2024-12-14 MED ORDER — GUAIFENESIN ER 600 MG PO TB12: 600.0000 mg | ORAL_TABLET | Freq: Two times a day (BID) | ORAL | 0 refills | Status: AC

## 2024-12-14 MED ORDER — AMOXICILLIN-POT CLAVULANATE 875-125 MG PO TABS: 1.0000 | ORAL_TABLET | Freq: Two times a day (BID) | ORAL | 0 refills | Status: DC

## 2024-12-14 MED ORDER — PREDNISONE 50 MG PO TABS: ORAL_TABLET | ORAL | 0 refills | Status: AC

## 2024-12-14 NOTE — ED Provider Notes (Signed)
 " [facility]    CSN: [id number] Arrival date & time: 12/14/24  9157      History   Chief Complaint Chief Complaint  Patient presents with   Cough    HPI Sandra [name] is a 74 y.o. female.   Pt presents today due to 1-2 weeks of cough productive of yellow sputum. Pt denies fever or shortness of breath. Pt states that she has been using OTC cold medicine for symptoms. Pt denies nausea, vomiting, or loss of appetite.   The history is provided by the patient.  Cough   Past Medical History:  Diagnosis Date   Hyperlipidemia    Osteopenia    ??    Patient Active Problem List   Diagnosis Date Noted   Mild persistent asthma with exacerbation 12/06/2023   ETD (Eustachian tube dysfunction), right 12/14/2021   Asthma 11/08/2021   Allergic rhinitis 09/06/2019   History of alcohol abuse 03/26/2019   Genital herpes 03/23/2018   Prediabetes 03/25/2017   Obesity (BMI 30-39.9) 05/02/2014   Leg swelling 09/16/2013   Vitamin D  deficiency 03/01/2013   Osteopenia 03/30/2010   Hyperlipidemia 03/17/2008    Past Surgical History:  Procedure Laterality Date   CATARACT EXTRACTION Bilateral    COLONOSCOPY     Dr Hercules   DILATION AND CURETTAGE OF UTERUS     [id number]     WISDOM TOOTH EXTRACTION      OB History   No obstetric history on file.      Home Medications    Prior to Admission medications  Medication Sig Start Date End Date Taking? Authorizing Provider  albuterol  (VENTOLIN  HFA) 108 (90 Base) MCG/ACT inhaler Inhale 2 puffs into the lungs every 6 (six) hours as needed for wheezing or shortness of breath. 10/13/23  Yes Burns, Glade PARAS, MD  benzonatate  (TESSALON ) 100 MG capsule Take 1 capsule (100 mg total) by mouth every 8 (eight) hours. 12/14/24  Yes Andra Krabbe C, PA-C  Calcium  Citrate-Vitamin D  (CITRACAL + D PO) Citracal   Yes [provider]  ezetimibe  (ZETIA ) 10 MG tablet TAKE 1 TABLET BY MOUTH EVERY DAY 09/30/24  Yes Burns, Glade PARAS, MD   fluticasone  (FLONASE ) 50 MCG/ACT nasal spray Place 2 sprays into both nostrils daily. 12/14/21  Yes Burns, Glade PARAS, MD  guaiFENesin  (MUCINEX ) 600 MG 12 hr tablet Take 1 tablet (600 mg total) by mouth 2 (two) times daily for 10 days. 12/14/24 12/24/24 Yes Andra Krabbe C, PA-C  hydrochlorothiazide  (MICROZIDE ) 12.5 MG capsule TAKE 1 CAPSULE BY MOUTH EVERY DAY 11/11/24  Yes Geofm Glade PARAS, MD  predniSONE  (DELTASONE ) 50 MG tablet Take 1 tab po daily for 5 days 12/14/24  Yes Andra Krabbe C, PA-C  rosuvastatin  (CRESTOR ) 20 MG tablet TAKE 1 TABLET BY MOUTH EVERY DAY 10/31/24  Yes Burns, Glade PARAS, MD  Calcium  Carbonate-Vitamin D  600-400 MG-UNIT tablet Take 1 tablet by mouth 2 (two) times daily. Patient not taking: Reported on 12/14/2024    [provider]  ketorolac (ACULAR) 0.5 % ophthalmic solution Place 1 drop into the left eye 3 (three) times daily. Patient not taking: Reported on 12/14/2024 03/30/20   [provider]  levocetirizine (XYZAL ) 5 MG tablet Take 1 tablet (5 mg total) by mouth every evening. Patient not taking: Reported on 12/14/2024 12/23/21   Geofm Glade PARAS, MD  triamcinolone ointment (KENALOG) 0.1 % Apply topically 2 (two) times daily. Patient not taking: Reported on 12/14/2024 05/02/22   [provider]  valACYclovir  (VALTREX )  1000 MG tablet Take by mouth. Patient not taking: Reported on 12/14/2024 05/02/22   [provider]  valACYclovir  (VALTREX ) 500 MG tablet Take 1 tablet (500 mg total) by mouth 2 (two) times daily. Take for 3 days for outbreak Patient not taking: Reported on 12/14/2024 07/13/21   Geofm Glade PARAS, MD    Family History Family History  Problem Relation Age of Onset   Heart disease Mother        irreg heart rate & heart failure   Stroke Father        Alsheimer's   Hyperlipidemia Sister    Glaucoma Sister    Hyperlipidemia Brother    Heart attack Maternal Grandmother        < 65   Glaucoma Maternal Aunt    Macular  degeneration Maternal Aunt    Hypertension Maternal Aunt    Asthma Maternal Aunt    Cancer Paternal Aunt        X 2 had breast cncer; 1 aunt cervical cancer   Diabetes Other        mat. & pat sides   Hypertension Other        mat & pat sides    Social History Social History[1]   Allergies   Patient has no known allergies.   Review of Systems Review of Systems  Respiratory:  Positive for cough.      Physical Exam Triage Vital Signs ED Triage Vitals  Encounter Vitals Group     BP 12/14/24 0954 (!) 149/89     Girls Systolic BP Percentile --      Girls Diastolic BP Percentile --      Boys Systolic BP Percentile --      Boys Diastolic BP Percentile --      Pulse Rate 12/14/24 0954 (!) 101     Resp 12/14/24 0954 18     Temp 12/14/24 0954 99.7 F (37.6 C)     Temp Source 12/14/24 0954 Oral     SpO2 12/14/24 0954 98 %     Weight --      Height --      Head Circumference --      Peak Flow --      Pain Score 12/14/24 0955 0     Pain Loc --      Pain Education --      Exclude from Growth Chart --    No data found.  Updated Vital Signs BP (!) 149/89 (BP Location: Right Arm)   Pulse (!) 101   Temp 99.7 F (37.6 C) (Oral)   Resp 18   SpO2 98%   Visual Acuity Right Eye Distance:   Left Eye Distance:   Bilateral Distance:    Right Eye Near:   Left Eye Near:    Bilateral Near:     Physical Exam Vitals and nursing note reviewed.  Constitutional:      General: She is not in acute distress.    Appearance: Normal appearance. She is not ill-appearing, toxic-appearing or diaphoretic.  HENT:     Nose: Congestion (markedly enlarged turbinates) present. No rhinorrhea.     Mouth/Throat:     Mouth: Mucous membranes are moist.     Pharynx: Oropharynx is clear. No oropharyngeal exudate or posterior oropharyngeal erythema.  Eyes:     General: No scleral icterus. Cardiovascular:     Rate and Rhythm: Normal rate and regular rhythm.     Heart sounds: Normal heart  sounds.  Pulmonary:  Effort: Pulmonary effort is normal. No respiratory distress.     Breath sounds: Normal breath sounds. No wheezing or rhonchi.  Skin:    General: Skin is warm.  Neurological:     Mental Status: She is alert and oriented to person, place, and time.  Psychiatric:        Mood and Affect: Mood normal.        Behavior: Behavior normal.      UC Treatments / Results  Labs (all labs ordered are listed, but only abnormal results are displayed) Labs Reviewed - No data to display  EKG   Radiology DG Chest 2 View Result Date: 12/14/2024 EXAM: 2 VIEW(S) XRAY OF THE CHEST 12/14/2024 10:44:14 AM COMPARISON: 06/05/2016 CLINICAL HISTORY: Cough. FINDINGS: LUNGS AND PLEURA: No focal pulmonary opacity. No pleural effusion. No pneumothorax. HEART AND MEDIASTINUM: No acute abnormality of the cardiac and mediastinal silhouettes. BONES AND SOFT TISSUES: Thoracic spondylosis. No acute osseous abnormality. IMPRESSION: 1. No acute cardiopulmonary abnormality. 2. Thoracic spondylosis. Electronically signed by: Ryan Salvage MD 12/14/2024 11:30 AM EST RP Workstation: [id number]    Procedures Procedures (including critical care time)  Medications Ordered in UC Medications - No data to display  Initial Impression / Assessment and Plan / UC Course  I have reviewed the triage vital signs and the nursing notes.  Pertinent labs & imaging results that were available during my care of the patient were reviewed by me and considered in my medical decision making (see chart for details).     Final Clinical Impressions(s) / UC Diagnoses   Final diagnoses:  Cough, unspecified type  Acute bronchitis, unspecified organism     Discharge Instructions      You have been diagnosed with acute bronchitis will send prednisone , tessalon , and mucinex  to pharmacy for symptoms.        ED Prescriptions     Medication Sig Dispense Auth. Provider   amoxicillin -clavulanate (AUGMENTIN )  875-125 MG tablet  (Status: Discontinued) Take 1 tablet by mouth every 12 (twelve) hours for 7 days. 14 tablet Andra Krabbe C, PA-C   azithromycin  (ZITHROMAX ) 250 MG tablet  (Status: Discontinued) Take 1 tablet (250 mg total) by mouth daily. Take first 2 tablets together, then 1 every day until finished. 6 tablet Lavra Imler C, PA-C   predniSONE  (DELTASONE ) 50 MG tablet Take 1 tab po daily for 5 days 5 tablet Andra Krabbe BROCKS, PA-C   benzonatate  (TESSALON ) 100 MG capsule Take 1 capsule (100 mg total) by mouth every 8 (eight) hours. 30 capsule Andra Krabbe C, PA-C   guaiFENesin  (MUCINEX ) 600 MG 12 hr tablet Take 1 tablet (600 mg total) by mouth 2 (two) times daily for 10 days. 20 tablet Andra Krabbe BROCKS, PA-C      PDMP not reviewed this encounter.    [1]  Social History Tobacco Use   Smoking status: Never    Passive exposure: Never   Smokeless tobacco: Never  Vaping Use   Vaping status: Never Used  Substance Use Topics   Alcohol use: Yes    Comment: 1 beers a day   Drug use: No     Andra Krabbe BROCKS, PA-C 12/14/24 1140  "

## 2024-12-14 NOTE — ED Triage Notes (Signed)
 Pt reports cough for over a week. Coughing up yellow mucous. She has tried nasal spray, cough drops and sudafed with minimal relief. Denies known fever. States she has also had to use her albuterol  inhaler.

## 2024-12-14 NOTE — Discharge Instructions (Addendum)
 You have been diagnosed with acute bronchitis will send prednisone , tessalon , and mucinex  to pharmacy for symptoms.
